# Patient Record
Sex: Female | Born: 1980 | Race: White | Hispanic: No | Marital: Married | Smoking: Former smoker
Health system: Southern US, Community
[De-identification: ages and names within clinical notes are randomized; demographics above are authoritative.]

## PROBLEM LIST (undated history)

## (undated) DIAGNOSIS — Z8719 Personal history of other diseases of the digestive system: Secondary | ICD-10-CM

## (undated) DIAGNOSIS — G43909 Migraine, unspecified, not intractable, without status migrainosus: Secondary | ICD-10-CM

## (undated) DIAGNOSIS — F32A Depression, unspecified: Secondary | ICD-10-CM

## (undated) DIAGNOSIS — Z87442 Personal history of urinary calculi: Secondary | ICD-10-CM

## (undated) DIAGNOSIS — F419 Anxiety disorder, unspecified: Secondary | ICD-10-CM

## (undated) DIAGNOSIS — J189 Pneumonia, unspecified organism: Secondary | ICD-10-CM

## (undated) DIAGNOSIS — M1990 Unspecified osteoarthritis, unspecified site: Secondary | ICD-10-CM

## (undated) HISTORY — PX: ABDOMINAL HYSTERECTOMY: SHX81

## (undated) HISTORY — PX: CHOLECYSTECTOMY: SHX55

## (undated) HISTORY — PX: COLONOSCOPY: SHX174

## (undated) HISTORY — PX: ESOPHAGOGASTRODUODENOSCOPY: SHX1529

## (undated) HISTORY — PX: GASTRIC BYPASS: SHX52

## (undated) HISTORY — PX: OTHER SURGICAL HISTORY: SHX169

## (undated) HISTORY — PX: HERNIA REPAIR: SHX51

---

## 2018-01-02 HISTORY — PX: BARIATRIC SURGERY: SHX1103

## 2019-12-16 ENCOUNTER — Emergency Department
Admission: EM | Admit: 2019-12-16 | Discharge: 2019-12-16 | Disposition: A | Discharge status: Home / Self Care | Attending: Emergency Medicine | Admitting: Emergency Medicine

## 2019-12-16 ENCOUNTER — Emergency Department

## 2019-12-16 ENCOUNTER — Other Ambulatory Visit: Payer: Self-pay

## 2019-12-16 DIAGNOSIS — Z9884 Bariatric surgery status: Secondary | ICD-10-CM | POA: Insufficient documentation

## 2019-12-16 DIAGNOSIS — N200 Calculus of kidney: Secondary | ICD-10-CM | POA: Diagnosis not present

## 2019-12-16 DIAGNOSIS — Z87891 Personal history of nicotine dependence: Secondary | ICD-10-CM | POA: Diagnosis not present

## 2019-12-16 DIAGNOSIS — R1084 Generalized abdominal pain: Secondary | ICD-10-CM

## 2019-12-16 HISTORY — DX: Migraine, unspecified, not intractable, without status migrainosus: G43.909

## 2019-12-16 LAB — COMPREHENSIVE METABOLIC PANEL
ALT: 23 U/L (ref 0–44)
AST: 21 U/L (ref 15–41)
Albumin: 4.3 g/dL (ref 3.5–5.0)
Alkaline Phosphatase: 47 U/L (ref 38–126)
Anion gap: 9 (ref 5–15)
BUN: 9 mg/dL (ref 6–20)
CO2: 24 mmol/L (ref 22–32)
Calcium: 9.2 mg/dL (ref 8.9–10.3)
Chloride: 106 mmol/L (ref 98–111)
Creatinine, Ser: 1.06 mg/dL — ABNORMAL HIGH (ref 0.44–1.00)
GFR calc non Af Amer: >60 mL/min (ref >60)
Glucose, Bld: 149 mg/dL — ABNORMAL HIGH (ref 70–99)
Potassium: 3.8 mmol/L (ref 3.5–5.1)
Sodium: 139 mmol/L (ref 135–145)
Total Bilirubin: 0.5 mg/dL (ref 0.3–1.2)
Total Protein: 7.1 g/dL (ref 6.5–8.1)

## 2019-12-16 LAB — CBC
HCT: 42.5 % (ref 36.0–46.0)
Hemoglobin: 14.4 g/dL (ref 12.0–15.0)
MCH: 32.4 pg (ref 26.0–34.0)
MCHC: 33.9 g/dL (ref 30.0–36.0)
MCV: 95.7 fL (ref 80.0–100.0)
Platelets: 308 10*3/uL (ref 150–400)
RBC: 4.44 MIL/uL (ref 3.87–5.11)
RDW: 12.4 % (ref 11.5–15.5)
WBC: 8.3 10*3/uL (ref 4.0–10.5)
nRBC: 0 % (ref 0.0–0.2)

## 2019-12-16 LAB — URINALYSIS, COMPLETE (UACMP) WITH MICROSCOPIC
Bilirubin Urine: NEGATIVE
Glucose, UA: NEGATIVE mg/dL
Ketones, ur: NEGATIVE mg/dL
Leukocytes,Ua: NEGATIVE
Nitrite: NEGATIVE
Protein, ur: NEGATIVE mg/dL
Specific Gravity, Urine: 1.013 (ref 1.005–1.030)
pH: 5 (ref 5.0–8.0)

## 2019-12-16 LAB — POCT PREGNANCY, URINE: Preg Test, Ur: NEGATIVE

## 2019-12-16 LAB — LIPASE, BLOOD: Lipase: 42 U/L (ref 11–51)

## 2019-12-16 LAB — LACTIC ACID, PLASMA: Lactic Acid, Venous: 0.9 mmol/L (ref 0.5–1.9)

## 2019-12-16 MED ORDER — ONDANSETRON HCL 4 MG/2ML IJ SOLN
4.0000 mg | Freq: Once | INTRAMUSCULAR | Status: AC
  Administered 2019-12-16: 4 mg via INTRAVENOUS
  Filled 2019-12-16: qty 2

## 2019-12-16 MED ORDER — MORPHINE SULFATE (PF) 4 MG/ML IV SOLN
4.0000 mg | Freq: Once | INTRAVENOUS | Status: AC
  Administered 2019-12-16: 4 mg via INTRAVENOUS
  Filled 2019-12-16: qty 1

## 2019-12-16 MED ORDER — IOHEXOL 9 MG/ML PO SOLN
500.0000 mL | Freq: Once | ORAL | Status: AC
  Administered 2019-12-16: 500 mL via ORAL
  Filled 2019-12-16: qty 500

## 2019-12-16 MED ORDER — IOHEXOL 9 MG/ML PO SOLN
500.0000 mL | ORAL | Status: DC
  Filled 2019-12-16 (×2): qty 500

## 2019-12-16 MED ORDER — IOHEXOL 300 MG/ML  SOLN
100.0000 mL | Freq: Once | INTRAMUSCULAR | Status: AC | PRN
  Administered 2019-12-16: 100 mL via INTRAVENOUS
  Filled 2019-12-16: qty 100

## 2019-12-16 MED ORDER — LACTATED RINGERS IV BOLUS
1000.0000 mL | Freq: Once | INTRAVENOUS | Status: AC
  Administered 2019-12-16: 1000 mL via INTRAVENOUS

## 2019-12-16 MED ORDER — DROPERIDOL 2.5 MG/ML IJ SOLN
2.5000 mg | Freq: Once | INTRAMUSCULAR | Status: AC
  Administered 2019-12-16: 2.5 mg via INTRAVENOUS
  Filled 2019-12-16: qty 2

## 2019-12-16 NOTE — Discharge Instructions (Signed)
You were seen in the ED because of your abdominal pain. We did extensive blood work that showed no signs of illness. We did a CT scan with contrast that showed normal-appearing intestines and no evidence of obstruction.  Continue your normal medications at home.  I would recommend a bland diet for the next few days to settle your stomach.  Follow-up with your PCP.  Return to the ED with any worsening symptoms

## 2019-12-16 NOTE — ED Triage Notes (Signed)
Pt c/o generalized abd pain with N/V over the past week, states she has not had a BM in 2 days. States she has had gastric bipass, and other multiple abd surgeries

## 2019-12-16 NOTE — ED Provider Notes (Signed)
[facility] Emergency Department Provider Note ____________________________________________   First MD Initiated Contact with Patient 12/16/19 1432     (approximate)  I have reviewed the triage vital signs and the nursing notes.  HISTORY  Chief Complaint Abdominal Pain   HPI Karen Park is a 39 y.o. femalewho presents to the ED for evaluation of abdominal pain.   Chart review indicates history of migraines and multiple previous abdominal surgeries. Patient reports recently moving to the area from [facility], her husband is deployed with the [employer].  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   Patient reports a couple days of abdominal discomfort/cramping sensation that was self-limiting and she minimizes the symptoms, and occurred about 7 days ago. Patient reports developing severe global abdominal pain, nausea, vomiting and inability to tolerate p.o. intake for the past 2 days.  She reports she is unable to keep anything down as it immediately causes her to vomit.  She reports severe global pain that is 10/10 intensity, for which she has not taken any medications.  Nonradiating and aching in nature.  Denies dysuria, vaginal discharge or bleeding.  She reports she does not recall passing any gas for the past 2 days, but is uncertain of this.  Denies fevers, chest pain, syncope.  Past Medical History:  Diagnosis Date  . Migraine     There are no problems to display for this patient.   Past Surgical History:  Procedure Laterality Date  . CESAREAN SECTION    . CHOLECYSTECTOMY    . GASTRIC BYPASS    . HERNIA REPAIR     x2    Prior to Admission medications   Not on File    Allergies Patient has no known allergies.  No family history on file.  Social History Social History   Tobacco Use  . Smoking status: Former Games developer  . Smokeless tobacco: Never Used  Vaping Use  . Vaping Use:  Every day  Substance Use Topics  . Alcohol use: Not Currently  . Drug use: Not Currently    Review of Systems  Constitutional: No fever/chills Eyes: No visual changes. ENT: No sore throat. Cardiovascular: Denies chest pain. Respiratory: Denies shortness of breath. Gastrointestinal: Positive for abdominal pain, nausea, vomiting and constipation.  No diarrhea. Genitourinary: Negative for dysuria. Musculoskeletal: Negative for back pain. Skin: Negative for rash. Neurological: Negative for headaches, focal weakness or numbness. ____________________________________________   PHYSICAL EXAM:  VITAL SIGNS: Vitals:   12/16/19 1700 12/16/19 1800  BP:  128/90  Pulse:  72  Resp: 18 18  Temp:  98.6 F (37 C)  SpO2:  100%    Constitutional: Alert and oriented.  Uncomfortable-appearing.  Curled up in a ball on her left side due to pain.  Appears to be extremely uncomfortable when changing positions in the bed to allow me to examine her abdomen. Eyes: Conjunctivae are normal. PERRL. EOMI. Head: Atraumatic. Nose: No congestion/rhinnorhea. Mouth/Throat: Mucous membranes are dry.  Oropharynx non-erythematous. Neck: No stridor. No cervical spine tenderness to palpation. Cardiovascular: Normal rate, regular rhythm. Grossly normal heart sounds.  Good peripheral circulation. Respiratory: Normal respiratory effort.  No retractions. Lungs CTAB. Gastrointestinal: Firm and diffusely tender with voluntary guarding. Difficult to get her to relax  No abdominal bruits. No CVA tenderness. Musculoskeletal: No lower extremity tenderness nor edema.  No joint effusions. No signs of acute trauma. Neurologic:  Normal speech and language. No gross focal neurologic deficits are appreciated. No gait instability noted.  Skin:  Skin is warm, dry and intact. No rash noted. Psychiatric: Mood and affect are normal. Speech and behavior are normal.  ____________________________________________   LABS (all labs  ordered are listed, but only abnormal results are displayed)  Labs Reviewed  COMPREHENSIVE METABOLIC PANEL - Abnormal; Notable for the following components:      Result Value   Glucose, Bld 149 (*)    Creatinine, Ser 1.06 (*)    All other components within normal limits  URINALYSIS, COMPLETE (UACMP) WITH MICROSCOPIC - Abnormal; Notable for the following components:   Color, Urine YELLOW (*)    APPearance HAZY (*)    Hgb urine dipstick MODERATE (*)    Bacteria, UA RARE (*)    All other components within normal limits  LIPASE, BLOOD  CBC  LACTIC ACID, PLASMA  POC URINE PREG, ED  POCT PREGNANCY, URINE   ____________________________________________  RADIOLOGY  ED MD interpretation:   Official radiology report(s): CT ABDOMEN PELVIS W CONTRAST  Result Date: 12/16/2019 CLINICAL DATA:  Abdominal pain. History of multiple abdominal surgeries EXAM: CT ABDOMEN AND PELVIS WITH CONTRAST TECHNIQUE: Multidetector CT imaging of the abdomen and pelvis was performed using the standard protocol following bolus administration of intravenous contrast. CONTRAST:  OMNIPAQUE IOHEXOL 300 MG/ML  SOLN COMPARISON:  None. FINDINGS: Lower chest: No acute abnormality. Hepatobiliary: No focal liver abnormality is seen. Status post cholecystectomy. No biliary dilatation. Pancreas: Unremarkable. No pancreatic ductal dilatation or surrounding inflammatory changes. Spleen: Normal in size without focal abnormality. Adrenals/Urinary Tract: Unremarkable adrenal glands. 2 mm nonobstructing stone within the upper pole of the left kidney. No right-sided renal calculi. Kidneys enhance symmetrically. No hydronephrosis. Urinary bladder is within normal limits. Stomach/Bowel: Postsurgical changes of Roux-en-Y gastric bypass. Contrast is present within the small bowel and proximal colon. No dilated loops of bowel to suggest obstruction. A normal appendix is identified within the right lower quadrant (series 5, images 30-35). No  focal bowel thickening or inflammatory changes are evident. Vascular/Lymphatic: No significant vascular findings are present. No enlarged abdominal or pelvic lymph nodes. Reproductive: Uterus and bilateral adnexa are unremarkable. Other: Small volume of simple free fluid within the cul-de-sac. No organized abdominopelvic fluid collection. No pneumoperitoneum. Postsurgical changes from ventral abdominal wall hernia. No new or recurrent abdominal wall hernias. Musculoskeletal: No acute or significant osseous findings. IMPRESSION: 1. No acute abdominopelvic findings. Normal appendix. 2. Postsurgical changes of Roux-en-Y gastric bypass. No evidence of bowel obstruction. 3. Nonobstructing 2 mm left renal stone. 4. Small volume of simple free fluid within the cul-de-sac, which may be physiologic. Electronically Signed   By: Duanne Guess D.O.   On: 12/16/2019 17:02   ____________________________________________   PROCEDURES and INTERVENTIONS  Procedure(s) performed (including Critical Care):  Procedures  Medications  lactated ringers bolus 1,000 mL (0 mLs Intravenous Stopped 12/16/19 1757)  morphine 4 MG/ML injection 4 mg (4 mg Intravenous Given 12/16/19 1513)  ondansetron (ZOFRAN) injection 4 mg (4 mg Intravenous Given 12/16/19 1511)  iohexol (OMNIPAQUE) 9 MG/ML oral solution 500 mL (500 mLs Oral Contrast Given 12/16/19 1522)  morphine 4 MG/ML injection 4 mg (4 mg Intravenous Given 12/16/19 1618)  iohexol (OMNIPAQUE) 300 MG/ML solution 100 mL (100 mLs Intravenous Contrast Given 12/16/19 1629)  droperidol (INAPSINE) 2.5 MG/ML injection 2.5 mg (2.5 mg Intravenous Given 12/16/19 1724)    ____________________________________________   MDM / ED COURSE  39 year old female with extensive abdominal surgical history presents to the ED with generalized pain and vomiting, found to have no evidence of acute  pathology and ultimately amenable to outpatient management.  Normal vital signs on room air throughout.   Exam initially demonstrates an uncomfortable-appearing woman who is tearful, curled up in a ball and with significant voluntary abdominal guarding.  This makes it difficult to get a good examination as she has difficulty relaxing.  Blood work returns unremarkable with no leukocytosis, evidence of acidosis, lactic acidosis or any systemic illness.  Due to her extensive surgical history, CT imaging of her abdomen/pelvis with p.o. and IV contrast obtained and without evidence of SBO, internal hernia or other acute intra-abdominal pathology.  Patient has resolving symptoms after droperidol administration, tolerating liquids prior to discharge.  Her abdominal exam is much improved at this time, she is more relaxed with a soft and benign abdomen without peritoneal signs or significant tenderness.  I see no evidence of acute pathology to warrant further advanced imaging or hospitalization.  Advised patient of outpatient management and return precautions for the ED.  Patient medically stable for discharge home.  Clinical Course as of Dec 16 1855  Tue Dec 16, 2019  1605 Reassessed.  Patient reports marginal improvement of her pain.  Reexamination reveals continued tenderness.  Additional morphine ordered.  She has tolerated her oral contrast, and we will get CT imaging soon.   [DS]  1715 Educated patient on reassuring CT, and work-up overall without evidence of acute pathology.  She reports that she "just knows" that something is wrong.   [DS]  1736 Reassessed after droperidol.  Patient reports improving symptoms.  She is more mobile in the bed and looks improved to me.  We discussed outpatient management and return precautions for the ED.   [DS]    Clinical Course User Index [DS] Delton Prairie, MD   ____________________________________________   FINAL CLINICAL IMPRESSION(S) / ED DIAGNOSES  Final diagnoses:  Generalized abdominal pain  History of Roux-en-Y gastric bypass    ED Discharge Orders    None        Julen Rubert Katrinka Blazing   Note:  This document was prepared using Dragon voice recognition software and may include unintentional dictation errors.   Delton Prairie, MD 12/16/19 [phone]

## 2020-01-27 ENCOUNTER — Emergency Department
Admission: EM | Admit: 2020-01-27 | Discharge: 2020-01-27 | Disposition: A | Discharge status: Home / Self Care | Attending: Emergency Medicine | Admitting: Emergency Medicine

## 2020-01-27 ENCOUNTER — Other Ambulatory Visit: Payer: Self-pay

## 2020-01-27 DIAGNOSIS — R109 Unspecified abdominal pain: Secondary | ICD-10-CM | POA: Diagnosis not present

## 2020-01-27 DIAGNOSIS — K5900 Constipation, unspecified: Secondary | ICD-10-CM | POA: Insufficient documentation

## 2020-01-27 DIAGNOSIS — Z5321 Procedure and treatment not carried out due to patient leaving prior to being seen by health care provider: Secondary | ICD-10-CM | POA: Diagnosis not present

## 2020-01-27 DIAGNOSIS — R140 Abdominal distension (gaseous): Secondary | ICD-10-CM | POA: Insufficient documentation

## 2020-01-27 LAB — COMPREHENSIVE METABOLIC PANEL
ALT: 46 U/L — ABNORMAL HIGH (ref 0–44)
AST: 28 U/L (ref 15–41)
Albumin: 4.1 g/dL (ref 3.5–5.0)
Alkaline Phosphatase: 44 U/L (ref 38–126)
Anion gap: 7 (ref 5–15)
BUN: 18 mg/dL (ref 6–20)
CO2: 27 mmol/L (ref 22–32)
Calcium: 9 mg/dL (ref 8.9–10.3)
Chloride: 107 mmol/L (ref 98–111)
Creatinine, Ser: 0.82 mg/dL (ref 0.44–1.00)
GFR, Estimated: >60 mL/min (ref >60)
Glucose, Bld: 102 mg/dL — ABNORMAL HIGH (ref 70–99)
Potassium: 4.2 mmol/L (ref 3.5–5.1)
Sodium: 141 mmol/L (ref 135–145)
Total Bilirubin: 0.5 mg/dL (ref 0.3–1.2)
Total Protein: 7.2 g/dL (ref 6.5–8.1)

## 2020-01-27 LAB — CBC
HCT: 39 % (ref 36.0–46.0)
Hemoglobin: 13.1 g/dL (ref 12.0–15.0)
MCH: 32.3 pg (ref 26.0–34.0)
MCHC: 33.6 g/dL (ref 30.0–36.0)
MCV: 96.1 fL (ref 80.0–100.0)
Platelets: 310 10*3/uL (ref 150–400)
RBC: 4.06 MIL/uL (ref 3.87–5.11)
RDW: 12.4 % (ref 11.5–15.5)
WBC: 7.5 10*3/uL (ref 4.0–10.5)
nRBC: 0 % (ref 0.0–0.2)

## 2020-01-27 LAB — POC URINE PREG, ED: Preg Test, Ur: NEGATIVE

## 2020-01-27 LAB — URINALYSIS, COMPLETE (UACMP) WITH MICROSCOPIC
Bilirubin Urine: NEGATIVE
Glucose, UA: NEGATIVE mg/dL
Hgb urine dipstick: NEGATIVE
Ketones, ur: NEGATIVE mg/dL
Leukocytes,Ua: NEGATIVE
Nitrite: NEGATIVE
Protein, ur: NEGATIVE mg/dL
Specific Gravity, Urine: 1.012 (ref 1.005–1.030)
pH: 5 (ref 5.0–8.0)

## 2020-01-27 LAB — LIPASE, BLOOD: Lipase: 38 U/L (ref 11–51)

## 2020-01-27 NOTE — ED Notes (Signed)
Pt called in the WR with no response at this time 

## 2020-01-27 NOTE — ED Triage Notes (Signed)
Pt states no bm for 3 days with a hx of bowel obstructions and adhesions, reports attempting miralax without success, reports abd distention

## 2020-01-27 NOTE — ED Notes (Signed)
Pt called in the WR with no response, pt is not visualized at this time 

## 2020-05-31 ENCOUNTER — Inpatient Hospital Stay: Admission: RE | Admit: 2020-05-31 | Source: Ambulatory Visit

## 2020-06-03 ENCOUNTER — Other Ambulatory Visit

## 2020-06-11 ENCOUNTER — Other Ambulatory Visit: Payer: Self-pay | Admitting: Obstetrics and Gynecology

## 2020-07-02 ENCOUNTER — Ambulatory Visit
Admission: RE | Admit: 2020-07-02 | Discharge: 2020-07-02 | Disposition: A | Discharge status: Home / Self Care | Source: Ambulatory Visit | Attending: Certified Nurse Midwife | Admitting: Certified Nurse Midwife

## 2020-07-02 ENCOUNTER — Other Ambulatory Visit: Payer: Self-pay | Admitting: Certified Nurse Midwife

## 2020-07-02 ENCOUNTER — Other Ambulatory Visit (HOSPITAL_COMMUNITY): Payer: Self-pay | Admitting: Certified Nurse Midwife

## 2020-07-02 ENCOUNTER — Other Ambulatory Visit: Payer: Self-pay

## 2020-07-02 DIAGNOSIS — O200 Threatened abortion: Secondary | ICD-10-CM | POA: Insufficient documentation

## 2020-07-05 ENCOUNTER — Other Ambulatory Visit (HOSPITAL_COMMUNITY): Payer: Self-pay | Admitting: Certified Nurse Midwife

## 2020-07-05 ENCOUNTER — Other Ambulatory Visit: Payer: Self-pay | Admitting: Certified Nurse Midwife

## 2020-07-05 DIAGNOSIS — O3680X0 Pregnancy with inconclusive fetal viability, not applicable or unspecified: Secondary | ICD-10-CM

## 2020-07-09 ENCOUNTER — Ambulatory Visit
Admission: RE | Admit: 2020-07-09 | Discharge: 2020-07-09 | Disposition: A | Discharge status: Home / Self Care | Source: Ambulatory Visit | Attending: Certified Nurse Midwife | Admitting: Certified Nurse Midwife

## 2020-07-09 ENCOUNTER — Other Ambulatory Visit: Payer: Self-pay

## 2020-07-09 DIAGNOSIS — O3680X0 Pregnancy with inconclusive fetal viability, not applicable or unspecified: Secondary | ICD-10-CM | POA: Insufficient documentation

## 2020-07-14 ENCOUNTER — Other Ambulatory Visit: Payer: Self-pay | Admitting: Obstetrics and Gynecology

## 2020-07-16 ENCOUNTER — Other Ambulatory Visit

## 2020-07-21 ENCOUNTER — Other Ambulatory Visit

## 2020-07-23 ENCOUNTER — Encounter: Admission: RE | Payer: Self-pay | Source: Home / Self Care

## 2020-07-23 ENCOUNTER — Inpatient Hospital Stay: Admission: RE | Admit: 2020-07-23 | Source: Home / Self Care | Admitting: Obstetrics and Gynecology

## 2020-07-23 SURGERY — HYSTERECTOMY, TOTAL, ABDOMINAL, WITH SALPINGECTOMY
Anesthesia: General | Laterality: Bilateral

## 2020-07-26 NOTE — H&P (Signed)
Karen Park is a 40 y.o.here for TAH / bilateral salpingectomy and possible left oophorectomy  Pt with  heavy bleeding and clots .pt here to rediscuss options for menorrhagia and left pelvic pain .history of gastric bypass , hernia repair x2  l/s from SBO .  Pt wishes to pursue hysterectomy   EMBX insufficient tissue , pap neg  G1P1  Past Medical History:  has a past medical history of Allergy, Anxiety, Depression, GERD (gastroesophageal reflux disease), Migraines, Obesity, OCD (obsessive compulsive disorder), Sleep apnea, and Umbilical hernia.  Past Surgical History:  has a past surgical history that includes Gastric bypass open (02/04/2018); Colon surgery; Cesarean section (2014); Hernia repair (2020); Hernia repair (2014); and Cholecystectomy (10/2015). Family History: family history includes ADD / ADHD in her son; Alcohol abuse in her mother; Alzheimer's disease in her maternal grandfather; Cancer in her maternal grandmother and paternal grandfather; Depression in her sister; High blood pressure (Hypertension) in her father and mother; Hyperlipidemia (Elevated cholesterol) in her father; Insomnia in her sister; Mental illness in her mother; No Known Problems in her paternal grandmother; OCD in her son; ODD in her son; Post-traumatic stress disorder in her brother; Suicidality in her brother. Social History:  reports that she has been smoking cigarettes. She has never used smokeless tobacco. She reports current alcohol use. She reports that she does not use drugs. OB/GYN History:          OB History    Gravida  1   Para      Term      Preterm      AB      Living        SAB      IAB      Ectopic      Molar      Multiple      Live Births             Allergies: has No Known Allergies. Medications:  Current Outpatient Medications:  .  buPROPion (WELLBUTRIN XL) 150 MG XL tablet, Take 1 tablet (150 mg total) by mouth once daily, Disp: 90 tablet, Rfl: 1 .   busPIRone (BUSPAR) 7.5 MG tablet, Take 1 tablet (7.5 mg total) by mouth 2 (two) times daily, Disp: 180 tablet, Rfl: 1 .  cetirizine (ZYRTEC) 10 MG tablet, Take 10 mg by mouth once daily, Disp: , Rfl:  .  cholecalciferol (VITAMIN D3) 1000 unit capsule, Take 1,000 Units by mouth once daily, Disp: , Rfl:  .  cyanocobalamin (VITAMIN B12) 1000 MCG tablet, Take 1,000 mcg by mouth once daily, Disp: , Rfl:  .  FLUoxetine (PROZAC) 40 MG capsule, Take 2 capsules (80 mg total) by mouth once daily, Disp: 180 capsule, Rfl: 1 .  folic acid (FOLVITE) 1 MG tablet, Take 1 mg by mouth once daily, Disp: , Rfl:  .  hydrOXYzine (ATARAX) 50 MG tablet, Take 1 tablet (50 mg total) by mouth once daily, Disp: 90 tablet, Rfl: 1 .  montelukast (SINGULAIR) 10 mg tablet, , Disp: , Rfl:  .  multivitamin-min-iron-FA-vit K (BARIATRIC MULTIVITAMINS) 45 mg iron- 800 mcg-120 mcg Cap, Take 1 tablet by mouth once daily, Disp: , Rfl:  .  nystatin (MYCOSTATIN) 100,000 unit/gram cream, , Disp: , Rfl:  .  traZODone (DESYREL) 100 MG tablet, Take 1 tablet (100 mg total) by mouth nightly, Disp: 90 tablet, Rfl: 1  Current Facility-Administered Medications:  .  cyanocobalamin (VITAMIN B12) injection 1,000 mcg, 1,000 mcg, Intramuscular, Q30 Days, Babaoff, Cassie Freer,  MD, 1,000 mcg at 03/26/20 1534  Review of Systems: General:                      No fatigue or weight loss Eyes:                           No vision changes Ears:                            No hearing difficulty Respiratory:                No cough or shortness of breath Pulmonary:                  No asthma or shortness of breath Cardiovascular:           No chest pain, palpitations, dyspnea on exertion Gastrointestinal:          No abdominal bloating, chronic diarrhea, constipations, masses, pain or hematochezia Genitourinary:             No hematuria, dysuria, abnormal vaginal discharge, pelvic pain, Menometrorrhagia Lymphatic:                   No swollen lymph  nodes Musculoskeletal:         No muscle weakness Neurologic:                  No extremity weakness, syncope, seizure disorder Psychiatric:                  No history of depression, delusions or suicidal/homicidal ideation    Exam:      Vitals:   05/16/221507  BP: 103/63  Pulse: 88    Body mass index is 26.95 kg/m.  WDWN white/ female in NAD   Lungs: CTA  CV : RRR without murmur   Breast: exam done in sitting and lying position : No dimpling or retraction, no dominant mass, no spontaneous discharge, no axillary adenopathy Neck:  no thyromegaly Abdomen: soft , no mass, normal active bowel sounds,  non-tender, no rebound tenderness External genitalia: vulva /labia2x1 SK lesion on right side on mons Urethra: no prolapse Vagina: normal physiologic d/c Cervix: no lesions, no cervical motion tenderness  Uterus: normal size shape and contour, non-tender Adnexa:no mass, non-tender  Rectovaginal: Impression:   The primary encounter diagnosis was Menorrhagia with irregular cycle. A diagnosis of Pelvic pain in female was also pertinent to this visit.    Plan:   After a long discussion about risks of L/S surgery  Given multiple abdominal surgery and the risk of organ injury and alternative options ( lysteda ) vs endometrial ablation  The patient has elected to proceed to TAH / bilateral salpingectomy and possible left oophorectomy .  Risk discussed with pt. See [facility] notes         Vilma Prader, MD

## 2020-08-05 ENCOUNTER — Other Ambulatory Visit: Payer: Self-pay

## 2020-08-05 ENCOUNTER — Other Ambulatory Visit
Admission: RE | Admit: 2020-08-05 | Discharge: 2020-08-05 | Disposition: A | Discharge status: Home / Self Care | Source: Ambulatory Visit | Attending: Obstetrics and Gynecology | Admitting: Obstetrics and Gynecology

## 2020-08-05 HISTORY — DX: Personal history of urinary calculi: Z87.442

## 2020-08-05 HISTORY — DX: Unspecified osteoarthritis, unspecified site: M19.90

## 2020-08-05 HISTORY — DX: Depression, unspecified: F32.A

## 2020-08-05 HISTORY — DX: Anxiety disorder, unspecified: F41.9

## 2020-08-05 HISTORY — DX: Pneumonia, unspecified organism: J18.9

## 2020-08-05 HISTORY — DX: Personal history of other diseases of the digestive system: Z87.19

## 2020-08-05 NOTE — Patient Instructions (Addendum)
Your procedure is scheduled on: 08/16/20- Monday Report to the Registration Desk on the 1st floor of the [facility]. To find out your arrival time, please call [phone] between 1PM - 3PM on: 08/13/20 - Friday Report to [facility] on 08/12/20 at 8 am for Labs and Covid Test.  REMEMBER: Instructions that are not followed completely may result in serious medical risk, up to and including death; or upon the discretion of your surgeon and anesthesiologist your surgery may need to be rescheduled.  Do not eat food after midnight the night before surgery.  No gum chewing, lozengers or hard candies.  You may however, drink CLEAR liquids up to 2 hours before you are scheduled to arrive for your surgery. Do not drink anything within 2 hours of your scheduled arrival time.  Clear liquids include: - water  - apple juice without pulp - gatorade (not RED, PURPLE, OR BLUE) - black coffee or tea (Do NOT add milk or creamers to the coffee or tea) Do NOT drink anything that is not on this list.  In addition, your doctor has ordered for you to drink the provided  Ensure Pre-Surgery Clear Carbohydrate Drink  Drinking this carbohydrate drink up to two hours before surgery helps to reduce insulin resistance and improve patient outcomes. Please complete drinking 2 hours prior to scheduled arrival time.  TAKE THESE MEDICATIONS THE MORNING OF SURGERY WITH A SIP OF WATER:  - buPROPion (WELLBUTRIN XL) 150 MG 24 hr tablet - busPIRone (BUSPAR) 7.5 MG tablet - FLUoxetine (PROZAC) 40 MG capsule  One week prior to surgery: Stop Anti-inflammatories (NSAIDS) such as Advil, Aleve, Ibuprofen, Motrin, Naproxen, Naprosyn and Aspirin based products such as Excedrin, Goodys Powder, BC Powder.  Stop ANY OVER THE COUNTER supplements until after surgery.  No Alcohol for 24 hours before or after surgery.  No Smoking including e-cigarettes for 24 hours prior to surgery.  No chewable tobacco products for at least 6  hours prior to surgery.  No nicotine patches on the day of surgery.  Do not use any "recreational" drugs for at least a week prior to your surgery.  Please be advised that the combination of cocaine and anesthesia may have negative outcomes, up to and including death. If you test positive for cocaine, your surgery will be cancelled.  On the morning of surgery brush your teeth with toothpaste and water, you may rinse your mouth with mouthwash if you wish. Do not swallow any toothpaste or mouthwash.  Do not wear jewelry, make-up, hairpins, clips or nail polish.  Do not wear lotions, powders, or perfumes.   Do not shave body from the neck down 48 hours prior to surgery just in case you cut yourself which could leave a site for infection.  Also, freshly shaved skin may become irritated if using the CHG soap.  Contact lenses, hearing aids and dentures may not be worn into surgery.  Do not bring valuables to the hospital. [facility] is not responsible for any missing/lost belongings or valuables.   Use CHG Soap or wipes as directed on instruction sheet.  Bring your CPAP machine  with you to the hospital.  Notify your doctor if there is any change in your medical condition (cold, fever, infection).  Wear comfortable clothing (specific to your surgery type) to the hospital.  Plan for stool softeners for home use; pain medications have a tendency to cause constipation. You can also help prevent constipation by eating foods high in fiber such as fruits  and vegetables and drinking plenty of fluids as your diet allows.  After surgery, you can help prevent lung complications by doing breathing exercises.  Take deep breaths and cough every 1-2 hours. Your doctor may order a device called an Incentive Spirometer to help you take deep breaths. When coughing or sneezing, hold a pillow firmly against your incision with both hands. This is called "splinting." Doing this helps protect your incision.  It also decreases belly discomfort.  If you are being admitted to the hospital overnight, leave your suitcase in the car. After surgery it may be brought to your room.  If you are being discharged the day of surgery, you will not be allowed to drive home. You will need a responsible adult (18 years or older) to drive you home and stay with you that night.   If you are taking public transportation, you will need to have a responsible adult (18 years or older) with you. Please confirm with your physician that it is acceptable to use public transportation.   Please call the Pre-admissions Testing Dept. at [phone] if you have any questions about these instructions.  Surgery Visitation Policy:  Patients undergoing a surgery or procedure may have one family member or support person with them as long as that person is not COVID-19 positive or experiencing its symptoms.  That person may remain in the waiting area during the procedure.  Inpatient Visitation:    Visiting hours are 7 a.m. to 8 p.m. Inpatients will be allowed two visitors daily. The visitors may change each day during the patient's stay. No visitors under the age of 59. Any visitor under the age of 31 must be accompanied by an adult. The visitor must pass COVID-19 screenings, use hand sanitizer when entering and exiting the patient's room and wear a mask at all times, including in the patient's room. Patients must also wear a mask when staff or their visitor are in the room. Masking is required regardless of vaccination status.

## 2020-08-12 ENCOUNTER — Other Ambulatory Visit: Payer: Self-pay

## 2020-08-12 ENCOUNTER — Other Ambulatory Visit
Admission: RE | Admit: 2020-08-12 | Discharge: 2020-08-12 | Disposition: A | Discharge status: Home / Self Care | Payer: BLUE CROSS/BLUE SHIELD | Source: Ambulatory Visit | Attending: Obstetrics and Gynecology | Admitting: Obstetrics and Gynecology

## 2020-08-12 DIAGNOSIS — Z01812 Encounter for preprocedural laboratory examination: Secondary | ICD-10-CM | POA: Insufficient documentation

## 2020-08-12 DIAGNOSIS — Z20822 Contact with and (suspected) exposure to covid-19: Secondary | ICD-10-CM | POA: Diagnosis not present

## 2020-08-12 LAB — CBC
HCT: 36.8 % (ref 36.0–46.0)
Hemoglobin: 12.2 g/dL (ref 12.0–15.0)
MCH: 31.9 pg (ref 26.0–34.0)
MCHC: 33.2 g/dL (ref 30.0–36.0)
MCV: 96.1 fL (ref 80.0–100.0)
Platelets: 302 10*3/uL (ref 150–400)
RBC: 3.83 MIL/uL — ABNORMAL LOW (ref 3.87–5.11)
RDW: 12.5 % (ref 11.5–15.5)
WBC: 6.7 10*3/uL (ref 4.0–10.5)
nRBC: 0 % (ref 0.0–0.2)

## 2020-08-12 LAB — TYPE AND SCREEN
ABO/RH(D): A POS
Antibody Screen: NEGATIVE
Extend sample reason: TRANSFUSED

## 2020-08-12 LAB — BASIC METABOLIC PANEL
Anion gap: 7 (ref 5–15)
BUN: 12 mg/dL (ref 6–20)
CO2: 27 mmol/L (ref 22–32)
Calcium: 9 mg/dL (ref 8.9–10.3)
Chloride: 104 mmol/L (ref 98–111)
Creatinine, Ser: 0.86 mg/dL (ref 0.44–1.00)
GFR, Estimated: >60 mL/min (ref >60)
Glucose, Bld: 86 mg/dL (ref 70–99)
Potassium: 3.8 mmol/L (ref 3.5–5.1)
Sodium: 138 mmol/L (ref 135–145)

## 2020-08-13 LAB — SARS CORONAVIRUS 2 (TAT 6-24 HRS): SARS Coronavirus 2: NEGATIVE

## 2020-08-16 ENCOUNTER — Other Ambulatory Visit: Payer: Self-pay

## 2020-08-16 ENCOUNTER — Inpatient Hospital Stay: Payer: BLUE CROSS/BLUE SHIELD | Admitting: Anesthesiology

## 2020-08-16 ENCOUNTER — Inpatient Hospital Stay
Admission: RE | Admit: 2020-08-16 | Discharge: 2020-08-18 | DRG: 743 | Disposition: A | Discharge status: Home / Self Care | Payer: BLUE CROSS/BLUE SHIELD | Attending: Obstetrics and Gynecology | Admitting: Obstetrics and Gynecology

## 2020-08-16 ENCOUNTER — Encounter: Payer: Self-pay | Admitting: Obstetrics and Gynecology

## 2020-08-16 ENCOUNTER — Encounter
Admission: RE | Disposition: A | Discharge status: Home / Self Care | Payer: Self-pay | Source: Home / Self Care | Attending: Obstetrics and Gynecology

## 2020-08-16 DIAGNOSIS — F429 Obsessive-compulsive disorder, unspecified: Secondary | ICD-10-CM | POA: Diagnosis present

## 2020-08-16 DIAGNOSIS — F17210 Nicotine dependence, cigarettes, uncomplicated: Secondary | ICD-10-CM | POA: Diagnosis present

## 2020-08-16 DIAGNOSIS — Z20822 Contact with and (suspected) exposure to covid-19: Secondary | ICD-10-CM | POA: Diagnosis present

## 2020-08-16 DIAGNOSIS — F419 Anxiety disorder, unspecified: Secondary | ICD-10-CM | POA: Diagnosis present

## 2020-08-16 DIAGNOSIS — Z9884 Bariatric surgery status: Secondary | ICD-10-CM | POA: Diagnosis not present

## 2020-08-16 DIAGNOSIS — N920 Excessive and frequent menstruation with regular cycle: Principal | ICD-10-CM | POA: Diagnosis present

## 2020-08-16 DIAGNOSIS — R102 Pelvic and perineal pain: Secondary | ICD-10-CM | POA: Diagnosis present

## 2020-08-16 DIAGNOSIS — Z9889 Other specified postprocedural states: Secondary | ICD-10-CM

## 2020-08-16 DIAGNOSIS — F32A Depression, unspecified: Secondary | ICD-10-CM | POA: Diagnosis present

## 2020-08-16 HISTORY — PX: HYSTERECTOMY ABDOMINAL WITH SALPINGECTOMY: SHX6725

## 2020-08-16 HISTORY — PX: OOPHORECTOMY: SHX6387

## 2020-08-16 LAB — CBC
HCT: 32.8 % — ABNORMAL LOW (ref 36.0–46.0)
Hemoglobin: 11.5 g/dL — ABNORMAL LOW (ref 12.0–15.0)
MCH: 33 pg (ref 26.0–34.0)
MCHC: 35.1 g/dL (ref 30.0–36.0)
MCV: 94.3 fL (ref 80.0–100.0)
Platelets: 254 10*3/uL (ref 150–400)
RBC: 3.48 MIL/uL — ABNORMAL LOW (ref 3.87–5.11)
RDW: 12.5 % (ref 11.5–15.5)
WBC: 13 10*3/uL — ABNORMAL HIGH (ref 4.0–10.5)
nRBC: 0 % (ref 0.0–0.2)

## 2020-08-16 LAB — CREATININE, SERUM
Creatinine, Ser: 0.97 mg/dL (ref 0.44–1.00)
GFR, Estimated: >60 mL/min (ref >60)

## 2020-08-16 LAB — TYPE AND SCREEN
ABO/RH(D): A POS
Antibody Screen: NEGATIVE

## 2020-08-16 LAB — POCT PREGNANCY, URINE: Preg Test, Ur: NEGATIVE

## 2020-08-16 SURGERY — HYSTERECTOMY, TOTAL, ABDOMINAL, WITH SALPINGECTOMY
Anesthesia: General | Laterality: Left

## 2020-08-16 MED ORDER — LACTATED RINGERS IV SOLN: INTRAVENOUS | Status: DC

## 2020-08-16 MED ORDER — CHLORHEXIDINE GLUCONATE 0.12 % MT SOLN: 15.0000 mL | Freq: Once | OROMUCOSAL | Status: AC

## 2020-08-16 MED ORDER — ACETAMINOPHEN 500 MG PO TABS
1000.0000 mg | ORAL_TABLET | Freq: Four times a day (QID) | ORAL | Status: DC
  Administered 2020-08-16 – 2020-08-17 (×3): 1000 mg via ORAL
  Filled 2020-08-16 (×3): qty 2

## 2020-08-16 MED ORDER — ONDANSETRON HCL 4 MG/2ML IJ SOLN
INTRAMUSCULAR | Status: AC
  Filled 2020-08-16: qty 2

## 2020-08-16 MED ORDER — BUPIVACAINE LIPOSOME 1.3 % IJ SUSP
INTRAMUSCULAR | Status: AC
  Filled 2020-08-16: qty 20

## 2020-08-16 MED ORDER — LIDOCAINE HCL (CARDIAC) PF 100 MG/5ML IV SOSY
PREFILLED_SYRINGE | INTRAVENOUS | Status: DC | PRN
  Administered 2020-08-16: 80 mg via INTRAVENOUS

## 2020-08-16 MED ORDER — PHENYLEPHRINE HCL (PRESSORS) 10 MG/ML IV SOLN
INTRAVENOUS | Status: AC
  Filled 2020-08-16: qty 1

## 2020-08-16 MED ORDER — GABAPENTIN 300 MG PO CAPS
ORAL_CAPSULE | ORAL | Status: AC
  Administered 2020-08-16: 300 mg via ORAL
  Filled 2020-08-16: qty 1

## 2020-08-16 MED ORDER — OXYCODONE HCL 5 MG PO TABS
5.0000 mg | ORAL_TABLET | Freq: Once | ORAL | Status: AC | PRN
Start: 2020-08-16 — End: 2020-08-16
  Administered 2020-08-16: 5 mg via ORAL

## 2020-08-16 MED ORDER — PROPOFOL 10 MG/ML IV BOLUS
INTRAVENOUS | Status: DC | PRN
  Administered 2020-08-16: 25 ug/kg/min via INTRAVENOUS
  Administered 2020-08-16: 160 mg via INTRAVENOUS

## 2020-08-16 MED ORDER — ONDANSETRON HCL 4 MG/2ML IJ SOLN
INTRAMUSCULAR | Status: DC | PRN
  Administered 2020-08-16: 4 mg via INTRAVENOUS

## 2020-08-16 MED ORDER — FAMOTIDINE 20 MG PO TABS
ORAL_TABLET | ORAL | Status: AC
  Administered 2020-08-16: 20 mg via ORAL
  Filled 2020-08-16: qty 1

## 2020-08-16 MED ORDER — IBUPROFEN 600 MG PO TABS
600.0000 mg | ORAL_TABLET | Freq: Four times a day (QID) | ORAL | Status: DC
  Filled 2020-08-16: qty 1

## 2020-08-16 MED ORDER — CEFAZOLIN SODIUM-DEXTROSE 2-4 GM/100ML-% IV SOLN
2.0000 g | Freq: Once | INTRAVENOUS | Status: AC
  Administered 2020-08-16: 2 g via INTRAVENOUS

## 2020-08-16 MED ORDER — NALOXONE HCL 0.4 MG/ML IJ SOLN: 0.4000 mg | INTRAMUSCULAR | Status: DC | PRN

## 2020-08-16 MED ORDER — OXYCODONE HCL 5 MG/5ML PO SOLN: 5.0000 mg | Freq: Once | ORAL | Status: AC | PRN

## 2020-08-16 MED ORDER — GABAPENTIN 300 MG PO CAPS: 300.0000 mg | ORAL_CAPSULE | ORAL | Status: AC

## 2020-08-16 MED ORDER — MORPHINE SULFATE (PF) 2 MG/ML IV SOLN
1.0000 mg | INTRAVENOUS | Status: DC | PRN
  Administered 2020-08-16 (×2): 2 mg via INTRAVENOUS
  Filled 2020-08-16 (×2): qty 1

## 2020-08-16 MED ORDER — ORAL CARE MOUTH RINSE: 15.0000 mL | Freq: Once | OROMUCOSAL | Status: AC

## 2020-08-16 MED ORDER — ACETAMINOPHEN 500 MG PO TABS: 1000.0000 mg | ORAL_TABLET | ORAL | Status: AC

## 2020-08-16 MED ORDER — ROCURONIUM BROMIDE 10 MG/ML (PF) SYRINGE
PREFILLED_SYRINGE | INTRAVENOUS | Status: AC
  Filled 2020-08-16: qty 10

## 2020-08-16 MED ORDER — SODIUM CHLORIDE 0.9% FLUSH: 9.0000 mL | INTRAVENOUS | Status: DC | PRN

## 2020-08-16 MED ORDER — DIPHENHYDRAMINE HCL 50 MG/ML IJ SOLN: 12.5000 mg | Freq: Four times a day (QID) | INTRAMUSCULAR | Status: DC | PRN

## 2020-08-16 MED ORDER — HYDROMORPHONE HCL 1 MG/ML IJ SOLN
0.5000 mg | INTRAMUSCULAR | Status: AC | PRN
  Administered 2020-08-16 (×4): 0.5 mg via INTRAVENOUS

## 2020-08-16 MED ORDER — BUPIVACAINE HCL (PF) 0.5 % IJ SOLN
INTRAMUSCULAR | Status: AC
  Filled 2020-08-16: qty 30

## 2020-08-16 MED ORDER — DIPHENHYDRAMINE HCL 50 MG/ML IJ SOLN
INTRAMUSCULAR | Status: DC | PRN
  Administered 2020-08-16: 25 mg via INTRAVENOUS

## 2020-08-16 MED ORDER — KETOROLAC TROMETHAMINE 30 MG/ML IJ SOLN
INTRAMUSCULAR | Status: DC | PRN
  Administered 2020-08-16: 30 mg via INTRAVENOUS

## 2020-08-16 MED ORDER — PROPOFOL 10 MG/ML IV BOLUS
INTRAVENOUS | Status: AC
  Filled 2020-08-16: qty 20

## 2020-08-16 MED ORDER — KETOROLAC TROMETHAMINE 30 MG/ML IJ SOLN
INTRAMUSCULAR | Status: AC
  Filled 2020-08-16: qty 1

## 2020-08-16 MED ORDER — FENTANYL CITRATE (PF) 100 MCG/2ML IJ SOLN
INTRAMUSCULAR | Status: DC | PRN
  Administered 2020-08-16: 50 ug via INTRAVENOUS
  Administered 2020-08-16: 100 ug via INTRAVENOUS
  Administered 2020-08-16: 50 ug via INTRAVENOUS

## 2020-08-16 MED ORDER — MIDAZOLAM HCL 2 MG/2ML IJ SOLN
INTRAMUSCULAR | Status: AC
  Filled 2020-08-16: qty 2

## 2020-08-16 MED ORDER — FENTANYL CITRATE (PF) 100 MCG/2ML IJ SOLN
25.0000 ug | INTRAMUSCULAR | Status: DC | PRN
  Administered 2020-08-16: 50 ug via INTRAVENOUS
  Administered 2020-08-16 (×4): 25 ug via INTRAVENOUS

## 2020-08-16 MED ORDER — OXYCODONE HCL 5 MG PO TABS
ORAL_TABLET | ORAL | Status: AC
  Filled 2020-08-16: qty 1

## 2020-08-16 MED ORDER — ONDANSETRON HCL 4 MG PO TABS: 4.0000 mg | ORAL_TABLET | Freq: Four times a day (QID) | ORAL | Status: DC | PRN

## 2020-08-16 MED ORDER — ONDANSETRON HCL 4 MG/2ML IJ SOLN: 4.0000 mg | Freq: Four times a day (QID) | INTRAMUSCULAR | Status: DC | PRN

## 2020-08-16 MED ORDER — FENTANYL CITRATE (PF) 100 MCG/2ML IJ SOLN
INTRAMUSCULAR | Status: AC
  Filled 2020-08-16: qty 2

## 2020-08-16 MED ORDER — SIMETHICONE 80 MG PO CHEW
80.0000 mg | CHEWABLE_TABLET | Freq: Four times a day (QID) | ORAL | Status: DC | PRN
  Administered 2020-08-18: 80 mg via ORAL
  Filled 2020-08-16: qty 1

## 2020-08-16 MED ORDER — CHLORHEXIDINE GLUCONATE 0.12 % MT SOLN
OROMUCOSAL | Status: AC
  Administered 2020-08-16: 15 mL via OROMUCOSAL
  Filled 2020-08-16: qty 15

## 2020-08-16 MED ORDER — DEXAMETHASONE SODIUM PHOSPHATE 10 MG/ML IJ SOLN
INTRAMUSCULAR | Status: AC
  Filled 2020-08-16: qty 1

## 2020-08-16 MED ORDER — KETOROLAC TROMETHAMINE 30 MG/ML IJ SOLN: 30.0000 mg | Freq: Four times a day (QID) | INTRAMUSCULAR | Status: DC

## 2020-08-16 MED ORDER — DEXAMETHASONE SODIUM PHOSPHATE 10 MG/ML IJ SOLN
INTRAMUSCULAR | Status: DC | PRN
  Administered 2020-08-16: 10 mg via INTRAVENOUS

## 2020-08-16 MED ORDER — MIDAZOLAM HCL 2 MG/2ML IJ SOLN
INTRAMUSCULAR | Status: DC | PRN
  Administered 2020-08-16: 2 mg via INTRAVENOUS

## 2020-08-16 MED ORDER — MEPERIDINE HCL 25 MG/ML IJ SOLN
INTRAMUSCULAR | Status: AC
  Administered 2020-08-16: 25 mg
  Filled 2020-08-16: qty 1

## 2020-08-16 MED ORDER — POVIDONE-IODINE 10 % EX SWAB: 2.0000 "application " | Freq: Once | CUTANEOUS | Status: DC

## 2020-08-16 MED ORDER — LIDOCAINE HCL (PF) 2 % IJ SOLN
INTRAMUSCULAR | Status: AC
  Filled 2020-08-16: qty 5

## 2020-08-16 MED ORDER — HYDROMORPHONE HCL 1 MG/ML IJ SOLN
INTRAMUSCULAR | Status: AC
  Filled 2020-08-16: qty 1

## 2020-08-16 MED ORDER — FLUMAZENIL 0.5 MG/5ML IV SOLN
INTRAVENOUS | Status: AC
  Filled 2020-08-16: qty 10

## 2020-08-16 MED ORDER — SUGAMMADEX SODIUM 200 MG/2ML IV SOLN
INTRAVENOUS | Status: DC | PRN
  Administered 2020-08-16: 154.2 mg via INTRAVENOUS

## 2020-08-16 MED ORDER — OXYCODONE HCL 5 MG PO TABS
5.0000 mg | ORAL_TABLET | ORAL | Status: DC | PRN
Start: 2020-08-16 — End: 2020-08-16
  Administered 2020-08-16: 5 mg via ORAL
  Filled 2020-08-16: qty 1

## 2020-08-16 MED ORDER — FAMOTIDINE 20 MG PO TABS: 20.0000 mg | ORAL_TABLET | Freq: Once | ORAL | Status: AC

## 2020-08-16 MED ORDER — DIPHENHYDRAMINE HCL 12.5 MG/5ML PO ELIX
12.5000 mg | ORAL_SOLUTION | Freq: Four times a day (QID) | ORAL | Status: DC | PRN
  Filled 2020-08-16: qty 5

## 2020-08-16 MED ORDER — MORPHINE SULFATE 1 MG/ML IV SOLN PCA
INTRAVENOUS | Status: DC
Start: 2020-08-16 — End: 2020-08-17
  Administered 2020-08-16: 14.67 mg via INTRAVENOUS
  Administered 2020-08-17: 12.6 mg via INTRAVENOUS
  Administered 2020-08-17: 1 mL via INTRAVENOUS
  Filled 2020-08-16 (×2): qty 30

## 2020-08-16 MED ORDER — NALOXONE HCL 0.4 MG/ML IJ SOLN
INTRAMUSCULAR | Status: AC
  Filled 2020-08-16: qty 2

## 2020-08-16 MED ORDER — ROCURONIUM BROMIDE 100 MG/10ML IV SOLN
INTRAVENOUS | Status: DC | PRN
  Administered 2020-08-16 (×2): 10 mg via INTRAVENOUS
  Administered 2020-08-16: 50 mg via INTRAVENOUS
  Administered 2020-08-16: 10 mg via INTRAVENOUS
  Administered 2020-08-16 (×2): 20 mg via INTRAVENOUS

## 2020-08-16 MED ORDER — CEFAZOLIN SODIUM-DEXTROSE 2-4 GM/100ML-% IV SOLN
INTRAVENOUS | Status: AC
  Filled 2020-08-16: qty 100

## 2020-08-16 MED ORDER — IBUPROFEN 600 MG PO TABS: 600.0000 mg | ORAL_TABLET | Freq: Four times a day (QID) | ORAL | Status: DC

## 2020-08-16 MED ORDER — GABAPENTIN 300 MG PO CAPS
300.0000 mg | ORAL_CAPSULE | Freq: Every day | ORAL | Status: DC
  Administered 2020-08-16 – 2020-08-17 (×2): 300 mg via ORAL
  Filled 2020-08-16 (×2): qty 1

## 2020-08-16 MED ORDER — ENOXAPARIN SODIUM 40 MG/0.4ML IJ SOSY
40.0000 mg | PREFILLED_SYRINGE | INTRAMUSCULAR | Status: DC
  Administered 2020-08-17 – 2020-08-18 (×2): 40 mg via SUBCUTANEOUS
  Filled 2020-08-16 (×2): qty 0.4

## 2020-08-16 MED ORDER — DIPHENHYDRAMINE HCL 50 MG/ML IJ SOLN
INTRAMUSCULAR | Status: AC
  Filled 2020-08-16: qty 1

## 2020-08-16 MED ORDER — SODIUM CHLORIDE (PF) 0.9 % IJ SOLN
INTRAMUSCULAR | Status: AC
  Filled 2020-08-16: qty 50

## 2020-08-16 MED ORDER — ACETAMINOPHEN 500 MG PO TABS
ORAL_TABLET | ORAL | Status: AC
  Administered 2020-08-16: 1000 mg via ORAL
  Filled 2020-08-16: qty 2

## 2020-08-16 MED ORDER — KETOROLAC TROMETHAMINE 30 MG/ML IJ SOLN
30.0000 mg | Freq: Four times a day (QID) | INTRAMUSCULAR | Status: AC
  Administered 2020-08-16 – 2020-08-17 (×3): 30 mg via INTRAVENOUS
  Filled 2020-08-16 (×3): qty 1

## 2020-08-16 MED ORDER — ONDANSETRON HCL 4 MG/2ML IJ SOLN
4.0000 mg | Freq: Once | INTRAMUSCULAR | Status: AC | PRN
  Administered 2020-08-16: 4 mg via INTRAVENOUS

## 2020-08-16 SURGICAL SUPPLY — 43 items
CHLORAPREP W/TINT 26 (MISCELLANEOUS) ×3 IMPLANT
COVER WAND RF STERILE (DRAPES) ×3 IMPLANT
DRAPE LAP W/FLUID (DRAPES) ×3 IMPLANT
DRAPE UNDER BUTTOCK W/FLU (DRAPES) IMPLANT
DRSG TELFA 3X8 NADH (GAUZE/BANDAGES/DRESSINGS) ×3 IMPLANT
ELECT BLADE 6.5 EXT (BLADE) ×3 IMPLANT
ELECT REM PT RETURN 9FT ADLT (ELECTROSURGICAL) ×3
ELECTRODE REM PT RTRN 9FT ADLT (ELECTROSURGICAL) ×2 IMPLANT
GAUZE 4X4 16PLY RFD (DISPOSABLE) ×6 IMPLANT
GAUZE SPONGE 4X4 12PLY STRL (GAUZE/BANDAGES/DRESSINGS) ×3 IMPLANT
GLOVE SURG ENC MOIS LTX SZ7 (GLOVE) ×3 IMPLANT
GLOVE SURG SYN 8.0 (GLOVE) ×6 IMPLANT
GLOVE SURG UNDER POLY LF SZ6.5 (GLOVE) ×6 IMPLANT
GOWN STRL REUS W/ TWL LRG LVL3 (GOWN DISPOSABLE) ×4 IMPLANT
GOWN STRL REUS W/ TWL XL LVL3 (GOWN DISPOSABLE) ×2 IMPLANT
GOWN STRL REUS W/TWL LRG LVL3 (GOWN DISPOSABLE) ×2
GOWN STRL REUS W/TWL XL LVL3 (GOWN DISPOSABLE) ×1
KIT TURNOVER CYSTO (KITS) ×3 IMPLANT
LABEL OR SOLS (LABEL) ×3 IMPLANT
MANIFOLD NEPTUNE II (INSTRUMENTS) ×3 IMPLANT
NEEDLE HYPO 22GX1.5 SAFETY (NEEDLE) ×6 IMPLANT
PACK BASIN MAJOR ARMC (MISCELLANEOUS) ×3 IMPLANT
PENCIL SMOKE EVACUATOR (MISCELLANEOUS) ×3 IMPLANT
RETAINER VISCERA MED (MISCELLANEOUS) IMPLANT
SET CYSTO W/LG BORE CLAMP LF (SET/KITS/TRAYS/PACK) ×3 IMPLANT
SOL PREP PVP 2OZ (MISCELLANEOUS) ×3
SOLUTION PREP PVP 2OZ (MISCELLANEOUS) ×2 IMPLANT
SPONGE LAP 18X18 RF (DISPOSABLE) ×6 IMPLANT
STAPLER INSORB 30 2030 C-SECTI (MISCELLANEOUS) ×3 IMPLANT
STAPLER SKIN PROX 35W (STAPLE) IMPLANT
SURGILUBE 2OZ TUBE FLIPTOP (MISCELLANEOUS) IMPLANT
SUT PDS AB 1 TP1 96 (SUTURE) IMPLANT
SUT VIC AB 0 CT1 27 (SUTURE) ×3
SUT VIC AB 0 CT1 27XCR 8 STRN (SUTURE) ×6 IMPLANT
SUT VIC AB 0 CT1 36 (SUTURE) ×6 IMPLANT
SUT VIC AB 2-0 SH 27 (SUTURE) ×4
SUT VIC AB 2-0 SH 27XBRD (SUTURE) ×8 IMPLANT
SUT VICRYL 0 TIES 12 18 (SUTURE) ×3 IMPLANT
SYR 20ML LL LF (SYRINGE) ×6 IMPLANT
SYR 30ML LL (SYRINGE) ×3 IMPLANT
SYR BULB IRRIG 60ML STRL (SYRINGE) ×3 IMPLANT
TRAY FOLEY MTR SLVR 16FR STAT (SET/KITS/TRAYS/PACK) ×3 IMPLANT
WATER STERILE IRR 1000ML POUR (IV SOLUTION) ×3 IMPLANT

## 2020-08-16 NOTE — Op Note (Signed)
NAMELUA, FENG MEDICAL RECORD NO: [id number] ACCOUNT NO: [id number] DATE OF BIRTH: Jan 18, 1981 FACILITY: [facility] LOCATION: [facility] PHYSICIAN: Suzy Bouchard, MD  Operative Report   DATE OF PROCEDURE: 08/16/2020  PREOPERATIVE DIAGNOSES: 1.  Menorrhagia. 2.  Chronic left pelvic pain. 3.  Multiple prior abdominal surgeries.  POSTOPERATIVE DIAGNOSES: 1.  Menorrhagia. 2.  Chronic left pelvic pain. 3.  Multiple prior abdominal surgeries.  PROCEDURE:   1.  Total abdominal hysterectomy. 2.  Left salpingo-oophorectomy. 3.  Right salpingectomy.  SURGEON:  Suzy Bouchard, MD  FIRST ASSISTANT:  Christeen Douglas, MD  ANESTHESIA:  General endotracheal anesthesia.  INDICATIONS:  This is a 40 year old gravida 2, para 1, patient with a long history of menorrhagia with clot formation, passage unresolved by conservative therapy.  The patient has had multiple abdominal surgeries including gastric bypass surgery,  small-bowel obstruction, 2 ventral hernia repairs, cholecystectomy and cesarean section and has complained of a longstanding history of left pelvic pain.  FINDINGS:  Left fallopian tube densely adherent to the left ovary.  No other significant scar tissue identified.  DESCRIPTION OF PROCEDURE:  After adequate general endotracheal anesthesia, the patient was placed in the dorsal supine position, legs placed in the [place] stirrups.  Patient's abdomen, perineum and vagina were prepped and draped in normal sterile fashion.   Foley catheter was placed prior to draping of the patient.  The patient did receive 2 grams IV Ancef for surgical prophylaxis.  Timeout was performed.  Pfannenstiel incision was made two fingerbreadths above the symphysis pubis.  Sharp dissection was  used to identify the fascia.  Fascia was opened in a transverse fashion.  Peritoneum was opened sharply.  There was notable mesh repair from prior ventral hernia surgery.  An O'Connor-O'Sullivan  retractor was placed into the patient's abdomen and the  bowel was packed cephalad with 4 laparotomy tapes, 2 Kelly clamps were then placed on the cornua to use for uterine retraction.  The round ligaments on both sides were clamped, transected, suture ligated with 0 Vicryl suture.  The anterior leaf of the  broad ligament was incised along the bladder reflection to the midline from both sides.  Bladder was then gently dissected off the lower uterine segment with sharp and blunt dissection.  Given the dense adherence of the left fallopian tube to the ovary  and given the patient's longstanding history of chronic pelvic pain the left infundibulopelvic ligament was bilaterally, clamped, transected, suture ligated with 0 Vicryl suture.  The left fallopian tube and ovary will be incorporated with the final  specimen.  On the right side, the broad ligament was opened and the uteroovarian ligament was bilaterally, clamped and transected the right fallopian tube was dissected free from the right ovary and fallopian tube will be included in the specimen.  The  uterine arteries were then skeletonized bilaterally, bilaterally clamped, transected, suture ligated with 0 Vicryl suture.  Cardinal ligaments were then bilaterally clamped, transected, suture ligated with 0 Vicryl suture and cervix was then clamped  bilaterally and the vagina was then entered and the cervix, uterus, left fallopian tube and ovary and right fallopian tube were then removed and passed off the operative field.  Vaginal cuff was closed with interrupted 0 Vicryl sutures.  Of note, the  ureters were identified prior to final clamping of the uterine arteries and removal of the cervix and uterus.  Normal peristaltic activity was seen.  The patient's abdomen was then copiously irrigated.  Good hemostasis was noted.  All pedicle  suture was  then removed.  Laparotomy sponges were removed.  Irrigation again revealed good hemostasis.  Fascia was then  closed with 0 Vicryl suture in a running nonlocking fashion.  Good approximation of fascial edges.  Subcutaneous tissues were irrigated and Bovie  for hemostasis and the skin was reapproximated with Insorb absorbable staples.  Good cosmetic effect.  ESTIMATED BLOOD LOSS:  150 mL  URINE OUTPUT:  300 mL.  INTRAOPERATIVE FLUIDS:  1300 mL.  The patient did receive 30 mg intravenous Toradol at the end of the case.  Sponge and needle counts were correct and the patient was taken to recovery room in good condition.   PUS D: 08/16/2020 10:35:27 am T: 08/16/2020 11:36:00 am  JOB: [id number]/ [id number]

## 2020-08-16 NOTE — Anesthesia Preprocedure Evaluation (Signed)
Anesthesia Evaluation  Patient identified by MRN, date of birth, ID band Patient awake    Reviewed: Allergy & Precautions, NPO status , Patient's Chart, lab work & pertinent test results  History of Anesthesia Complications Negative for: history of anesthetic complications  Airway Mallampati: II  TM Distance: >3 FB Neck ROM: Full    Dental no notable dental hx. (+) Teeth Intact   Pulmonary sleep apnea and Continuous Positive Airway Pressure Ventilation , neg COPD, Patient abstained from smoking.Not current smoker, former smoker,  OSA but has since lost 200 pounds after gastric bypass. Still wears her cpap in case   Pulmonary exam normal breath sounds clear to auscultation       Cardiovascular Exercise Tolerance: Good METS(-) hypertension(-) CAD and (-) Past MI negative cardio ROS  (-) dysrhythmias  Rhythm:Regular Rate:Normal - Systolic murmurs    Neuro/Psych  Headaches, PSYCHIATRIC DISORDERS Anxiety Depression    GI/Hepatic hiatal hernia, neg GERD  ,(+)     (-) substance abuse  , S/p gastric bypass    Endo/Other  neg diabetes  Renal/GU negative Renal ROS     Musculoskeletal   Abdominal   Peds  Hematology   Anesthesia Other Findings Past Medical History: No date: Anxiety No date: Arthritis     Comment:  both knees, fingers, spine No date: Depression No date: History of hiatal hernia     Comment:  repair 2020 No date: History of kidney stones No date: Migraine No date: Pneumonia  Reproductive/Obstetrics                             Anesthesia Physical Anesthesia Plan  ASA: II  Anesthesia Plan: General   Post-op Pain Management:    Induction: Intravenous  PONV Risk Score and Plan: 4 or greater and Ondansetron, Dexamethasone, Midazolam and Diphenhydramine  Airway Management Planned: Oral ETT  Additional Equipment: None  Intra-op Plan:   Post-operative Plan: Extubation  in OR  Informed Consent: I have reviewed the patients History and Physical, chart, labs and discussed the procedure including the risks, benefits and alternatives for the proposed anesthesia with the patient or authorized representative who has indicated his/her understanding and acceptance.     Dental advisory given  Plan Discussed with: CRNA and Surgeon  Anesthesia Plan Comments: (Discussed risks of anesthesia with patient, including PONV, sore throat, lip/dental damage. Rare risks discussed as well, such as cardiorespiratory and neurological sequelae. Patient understands.)        Anesthesia Quick Evaluation

## 2020-08-16 NOTE — Anesthesia Procedure Notes (Signed)
Procedure Name: Intubation Date/Time: 08/16/2020 7:40 AM Performed by: Rona Ravens, CRNA Pre-anesthesia Checklist: Patient identified, Patient being monitored, Timeout performed, Emergency Drugs available and Suction available Patient Re-evaluated:Patient Re-evaluated prior to induction Oxygen Delivery Method: Circle system utilized Preoxygenation: Pre-oxygenation with 100% oxygen Induction Type: IV induction Ventilation: Mask ventilation without difficulty Laryngoscope Size: Mac and 3 Grade View: Grade I Tube type: Oral Tube size: 6.5 mm Number of attempts: 1 Airway Equipment and Method: Stylet Placement Confirmation: ETT inserted through vocal cords under direct vision,  positive ETCO2 and breath sounds checked- equal and bilateral Secured at: 21 cm Tube secured with: Tape Dental Injury: Teeth and Oropharynx as per pre-operative assessment

## 2020-08-16 NOTE — Transfer of Care (Signed)
Immediate Anesthesia Transfer of Care Note  Patient: Karen Park  Procedure(s) Performed: HYSTERECTOMY ABDOMINAL WITH BILATERAL SALPINGECTOMY  (L) OOPHORECTOMY (Bilateral ) OOPHORECTOMY (Left )  Patient Location: PACU  Anesthesia Type:General  Level of Consciousness: awake, alert  and oriented  Airway & Oxygen Therapy: Patient Spontanous Breathing and Patient connected to face mask oxygen  Post-op Assessment: Report given to RN and Post -op Vital signs reviewed and stable  Post vital signs: Reviewed and stable  Last Vitals:  Vitals Value Taken Time  BP    Temp    Pulse    Resp    SpO2      Last Pain:  Vitals:   08/16/20 0636  TempSrc: Temporal  PainSc: 4       Patients Stated Pain Goal: 3 (08/16/20 0636)  Complications: No complications documented.

## 2020-08-16 NOTE — Anesthesia Postprocedure Evaluation (Signed)
Anesthesia Post Note  Patient: Karen Park  Procedure(s) Performed: HYSTERECTOMY ABDOMINAL WITH BILATERAL SALPINGECTOMY  (L) OOPHORECTOMY (Bilateral ) OOPHORECTOMY (Left )  Patient location during evaluation: PACU Anesthesia Type: General Level of consciousness: awake and alert Pain management: pain level controlled Vital Signs Assessment: post-procedure vital signs reviewed and stable Respiratory status: spontaneous breathing, nonlabored ventilation, respiratory function stable and patient connected to nasal cannula oxygen Cardiovascular status: blood pressure returned to baseline and stable Postop Assessment: no apparent nausea or vomiting Anesthetic complications: no   No complications documented.   Last Vitals:  Vitals:   08/16/20 1100 08/16/20 1115  BP: 119/80 122/83  Pulse: 92 97  Resp: 18 14  Temp:  36.9 C  SpO2: 97% 98%    Last Pain:  Vitals:   08/16/20 1115  TempSrc:   PainSc: Asleep                 Corinda Gubler

## 2020-08-16 NOTE — Progress Notes (Signed)
Day of Surgery Procedure(s) (LRB): HYSTERECTOMY ABDOMINAL WITH BILATERAL SALPINGECTOMY  (L) OOPHORECTOMY (Bilateral) OOPHORECTOMY (Left)  Subjective: Patient reports incisional pain.    Objective: I have reviewed patient's vital signs and intake and output.  small d/c on incisional dressing   Assessment: s/p Procedure(s): HYSTERECTOMY ABDOMINAL WITH BILATERAL SALPINGECTOMY  (L) OOPHORECTOMY (Bilateral) OOPHORECTOMY (Left): stable  Plan: switch to PCA for overnight  LOS: 0 days    Karen Park 08/16/2020, 6:24 PM

## 2020-08-16 NOTE — Progress Notes (Signed)
Pt ready for TAH/ BS possible left oophorectomy for Menorrhagia and pelvic pain . All question answered . NPO . Labs reviewed . Proceed

## 2020-08-16 NOTE — Brief Op Note (Signed)
08/16/2020  9:54 AM  PATIENT:  Karen Park  40 y.o. female  PRE-OPERATIVE DIAGNOSIS:  Menorrhagia, left pelvic pain  POST-OPERATIVE DIAGNOSIS:  Menorrhagia, left pelvic pain  PROCEDURE:  Procedure(s): HYSTERECTOMY ABDOMINAL WITH BILATERAL SALPINGECTOMY  (L) OOPHORECTOMY (Bilateral) OOPHORECTOMY (Left)  SURGEON:  Surgeon(s) and Role:    * Aidyn Kellis, Ihor Austin, MD - Primary    * Christeen Douglas, MD - Assisting  PHYSICIAN ASSISTANT: cst   ASSISTANTS: none   ANESTHESIA:   general  EBL:  150 mL  IOF 1300 UO 300cc  BLOOD ADMINISTERED:none  DRAINS: Urinary Catheter (Foley)   LOCAL MEDICATIONS USED:  NONE  SPECIMEN:  Source of Specimen:  cervix , uterus  bilateral fallopian tubes and left ovary   DISPOSITION OF SPECIMEN:  PATHOLOGY  COUNTS:  YES  TOURNIQUET:  * No tourniquets in log *  DICTATION: .Other Dictation: Dictation Number verbal   PLAN OF CARE: Admit to inpatient   PATIENT DISPOSITION:  PACU - hemodynamically stable.   Delay start of Pharmacological VTE agent (>24hrs) due to surgical blood loss or risk of bleeding: not applicable

## 2020-08-17 LAB — BASIC METABOLIC PANEL
Anion gap: 3 — ABNORMAL LOW (ref 5–15)
BUN: 7 mg/dL (ref 6–20)
CO2: 25 mmol/L (ref 22–32)
Calcium: 8.3 mg/dL — ABNORMAL LOW (ref 8.9–10.3)
Chloride: 102 mmol/L (ref 98–111)
Creatinine, Ser: 0.81 mg/dL (ref 0.44–1.00)
GFR, Estimated: >60 mL/min (ref >60)
Glucose, Bld: 100 mg/dL — ABNORMAL HIGH (ref 70–99)
Potassium: 3.5 mmol/L (ref 3.5–5.1)
Sodium: 130 mmol/L — ABNORMAL LOW (ref 135–145)

## 2020-08-17 LAB — CBC
HCT: 32.4 % — ABNORMAL LOW (ref 36.0–46.0)
Hemoglobin: 11 g/dL — ABNORMAL LOW (ref 12.0–15.0)
MCH: 32.8 pg (ref 26.0–34.0)
MCHC: 34 g/dL (ref 30.0–36.0)
MCV: 96.7 fL (ref 80.0–100.0)
Platelets: 243 10*3/uL (ref 150–400)
RBC: 3.35 MIL/uL — ABNORMAL LOW (ref 3.87–5.11)
RDW: 12.5 % (ref 11.5–15.5)
WBC: 7.9 10*3/uL (ref 4.0–10.5)
nRBC: 0 % (ref 0.0–0.2)

## 2020-08-17 MED ORDER — OXYCODONE HCL 5 MG PO TABS
5.0000 mg | ORAL_TABLET | ORAL | Status: DC | PRN
  Administered 2020-08-17 – 2020-08-18 (×3): 10 mg via ORAL
  Filled 2020-08-17 (×2): qty 1
  Filled 2020-08-17 (×2): qty 2

## 2020-08-17 MED ORDER — ACETAMINOPHEN 500 MG PO TABS
1000.0000 mg | ORAL_TABLET | Freq: Four times a day (QID) | ORAL | Status: DC
  Administered 2020-08-17 – 2020-08-18 (×4): 1000 mg via ORAL
  Filled 2020-08-17 (×4): qty 2

## 2020-08-17 MED ORDER — IBUPROFEN 800 MG PO TABS: 800.0000 mg | ORAL_TABLET | Freq: Three times a day (TID) | ORAL | Status: DC | PRN

## 2020-08-17 MED ORDER — ACETAMINOPHEN 500 MG PO TABS: 1000.0000 mg | ORAL_TABLET | Freq: Four times a day (QID) | ORAL | Status: DC | PRN

## 2020-08-17 MED ORDER — OXYCODONE-ACETAMINOPHEN 5-325 MG PO TABS
1.0000 | ORAL_TABLET | ORAL | Status: DC | PRN
  Administered 2020-08-17: 2 via ORAL
  Filled 2020-08-17: qty 2

## 2020-08-17 MED ORDER — IBUPROFEN 600 MG PO TABS: 600.0000 mg | ORAL_TABLET | Freq: Four times a day (QID) | ORAL | Status: DC

## 2020-08-17 MED ORDER — DOCUSATE SODIUM 100 MG PO CAPS
100.0000 mg | ORAL_CAPSULE | Freq: Every day | ORAL | Status: DC
  Administered 2020-08-17 – 2020-08-18 (×2): 100 mg via ORAL
  Filled 2020-08-17 (×2): qty 1

## 2020-08-17 NOTE — Progress Notes (Signed)
Obstetric and Gynecology  Subjective  Karen Park is a 40 y.o. female No obstetric history on file. who presented on 08/16/2020 for  POD#1 TAH LSo and BS . PAin elevated last pm and she was placed on a PCA . PAin much better this am . + Flatus        Objective   Vitals:   08/17/20 0748 08/17/20 0752  BP: 112/76   Pulse: 89   Resp: 16 13  Temp: 98.9 F (37.2 C)   SpO2: 96% 97%     Intake/Output Summary (Last 24 hours) at 08/17/2020 0936 Last data filed at 08/17/2020 2633 Gross per 24 hour  Intake 2660.11 ml  Output 3145 ml  Net -484.89 ml    General: NAD Cardiovascular: RRR, no murmurs Pulmonary: CTAB Abdomen: Benign. Non-tender, +BS, no guarding. Extremities: No erythema or cords, no calf tenderness, +warmth with normal peripheral pulses.  Labs: Results for orders placed or performed during the hospital encounter of 08/16/20 (from the past 24 hour(s))  CBC     Status: Abnormal   Collection Time: 08/16/20 12:12 PM  Result Value Ref Range   WBC 13.0 (H) 4.0 - 10.5 K/uL   RBC 3.48 (L) 3.87 - 5.11 MIL/uL   Hemoglobin 11.5 (L) 12.0 - 15.0 g/dL   HCT 35.4 (L) 56.2 - 56.3 %   MCV 94.3 80.0 - 100.0 fL   MCH 33.0 26.0 - 34.0 pg   MCHC 35.1 30.0 - 36.0 g/dL   RDW 89.3 73.4 - 28.7 %   Platelets 254 150 - 400 K/uL   nRBC 0.0 0.0 - 0.2 %  Creatinine, serum     Status: None   Collection Time: 08/16/20 12:12 PM  Result Value Ref Range   Creatinine, Ser 0.97 0.44 - 1.00 mg/dL   GFR, Estimated >68 >11 mL/min  CBC     Status: Abnormal   Collection Time: 08/17/20  6:14 AM  Result Value Ref Range   WBC 7.9 4.0 - 10.5 K/uL   RBC 3.35 (L) 3.87 - 5.11 MIL/uL   Hemoglobin 11.0 (L) 12.0 - 15.0 g/dL   HCT 57.2 (L) 62.0 - 35.5 %   MCV 96.7 80.0 - 100.0 fL   MCH 32.8 26.0 - 34.0 pg   MCHC 34.0 30.0 - 36.0 g/dL   RDW 97.4 16.3 - 84.5 %   Platelets 243 150 - 400 K/uL   nRBC 0.0 0.0 - 0.2 %  Basic metabolic panel     Status: Abnormal   Collection Time: 08/17/20  6:14 AM  Result  Value Ref Range   Sodium 130 (L) 135 - 145 mmol/L   Potassium 3.5 3.5 - 5.1 mmol/L   Chloride 102 98 - 111 mmol/L   CO2 25 22 - 32 mmol/L   Glucose, Bld 100 (H) 70 - 99 mg/dL   BUN 7 6 - 20 mg/dL   Creatinine, Ser 3.64 0.44 - 1.00 mg/dL   Calcium 8.3 (L) 8.9 - 10.3 mg/dL   GFR, Estimated >68 >03 mL/min   Anion gap 3 (L) 5 - 15    Cultures: Results for orders placed or performed during the hospital encounter of 08/12/20  SARS CORONAVIRUS 2 (TAT 6-24 HRS) Nasopharyngeal Nasopharyngeal Swab     Status: None   Collection Time: 08/12/20  8:36 AM   Specimen: Nasopharyngeal Swab  Result Value Ref Range Status   SARS Coronavirus 2 NEGATIVE NEGATIVE Final    Comment: (NOTE) SARS-CoV-2 target nucleic acids are NOT DETECTED.  The SARS-CoV-2 RNA is generally detectable in upper and lower respiratory specimens during the acute phase of infection. Negative results do not preclude SARS-CoV-2 infection, do not rule out co-infections with other pathogens, and should not be used as the sole basis for treatment or other patient management decisions. Negative results must be combined with clinical observations, patient history, and epidemiological information. The expected result is Negative.  Fact Sheet for Patients: HairSlick.no  Fact Sheet for Healthcare Providers: quierodirigir.com  This test is not yet approved or cleared by the [place] FDA and  has been authorized for detection and/or diagnosis of SARS-CoV-2 by FDA under an Emergency Use Authorization (EUA). This EUA will remain  in effect (meaning this test can be used) for the duration of the COVID-19 declaration under Se ction 564(b)(1) of the Act, 21 U.S.C. section 360bbb-3(b)(1), unless the authorization is terminated or revoked sooner.  Performed at [facility], [address] [place], [place] [address]     Imaging: No results found.   Assessment   40  y.o. No obstetric history on file. Hospital Day: 2 POD#1 doing well    Plan   1  D/C PCA+ foley 2. Advance reg diet  3. Po pain meds  4. OUT of bed today

## 2020-08-18 LAB — SURGICAL PATHOLOGY

## 2020-08-18 MED ORDER — BUSPIRONE HCL 15 MG PO TABS
7.5000 mg | ORAL_TABLET | Freq: Two times a day (BID) | ORAL | Status: DC
  Administered 2020-08-18: 7.5 mg via ORAL
  Filled 2020-08-18 (×2): qty 1

## 2020-08-18 MED ORDER — HYDROCODONE-ACETAMINOPHEN 5-325 MG PO TABS: 1.0000 | ORAL_TABLET | Freq: Four times a day (QID) | ORAL | 0 refills | Status: AC | PRN

## 2020-08-18 MED ORDER — ONDANSETRON HCL 4 MG PO TABS: 4.0000 mg | ORAL_TABLET | Freq: Three times a day (TID) | ORAL | 0 refills | Status: DC | PRN

## 2020-08-18 MED ORDER — GABAPENTIN 300 MG PO CAPS: 300.0000 mg | ORAL_CAPSULE | Freq: Every day | ORAL | 2 refills | Status: DC

## 2020-08-18 MED ORDER — FLUOXETINE HCL 10 MG PO CAPS
40.0000 mg | ORAL_CAPSULE | Freq: Two times a day (BID) | ORAL | Status: DC
  Administered 2020-08-18: 40 mg via ORAL
  Filled 2020-08-18: qty 4

## 2020-08-18 MED ORDER — BUPROPION HCL ER (XL) 150 MG PO TB24
150.0000 mg | ORAL_TABLET | Freq: Every day | ORAL | Status: DC
  Administered 2020-08-18: 150 mg via ORAL
  Filled 2020-08-18: qty 1

## 2020-08-18 NOTE — Discharge Summary (Signed)
Physician Discharge Summary  Patient ID: Karen Park MRN: [id number] DOB/AGE: 39-Feb-1982 39 y.o.  Admit date: 08/16/2020 Discharge date: 08/18/2020  Admission Diagnoses: menorrhagia and pelvic pain   Discharge Diagnoses:  Active Problems:   Menorrhagia   Post-operative state   Discharged Condition: good  Hospital Course: pt underwent a TAH/ LSO and right salpingectomy   Consults: None  Significant Diagnostic Studies: labs: No results found for this or any previous visit (from the past 24 hour(s)). Results for orders placed or performed during the hospital encounter of 08/16/20 (from the past 72 hour(s))  Pregnancy, urine POC     Status: None   Collection Time: 08/16/20  6:19 AM  Result Value Ref Range   Preg Test, Ur NEGATIVE NEGATIVE    Comment:        THE SENSITIVITY OF THIS METHODOLOGY IS >24 mIU/mL   Type and screen [facility]     Status: None   Collection Time: 08/16/20  6:45 AM  Result Value Ref Range   ABO/RH(D) A POS    Antibody Screen NEG    Sample Expiration      08/19/2020,2359 Performed at [facility], [address] [place], [place] [address]   CBC     Status: Abnormal   Collection Time: 08/16/20 12:12 PM  Result Value Ref Range   WBC 13.0 (H) 4.0 - 10.5 K/uL   RBC 3.48 (L) 3.87 - 5.11 MIL/uL   Hemoglobin 11.5 (L) 12.0 - 15.0 g/dL   HCT 02.4 (L) 09.7 - 35.3 %   MCV 94.3 80.0 - 100.0 fL   MCH 33.0 26.0 - 34.0 pg   MCHC 35.1 30.0 - 36.0 g/dL   RDW 29.9 24.2 - 68.3 %   Platelets 254 150 - 400 K/uL   nRBC 0.0 0.0 - 0.2 %    Comment: Performed at [facility], [address] [place], [place] [address]  Creatinine, serum     Status: None   Collection Time: 08/16/20 12:12 PM  Result Value Ref Range   Creatinine, Ser 0.97 0.44 - 1.00 mg/dL   GFR, Estimated >22 >97 mL/min    Comment: (NOTE) Calculated using the CKD-EPI Creatinine Equation (2021) Performed at [facility], [address]  [place], [place] [address]   CBC     Status: Abnormal   Collection Time: 08/17/20  6:14 AM  Result Value Ref Range   WBC 7.9 4.0 - 10.5 K/uL   RBC 3.35 (L) 3.87 - 5.11 MIL/uL   Hemoglobin 11.0 (L) 12.0 - 15.0 g/dL   HCT 19.4 (L) 17.4 - 08.1 %   MCV 96.7 80.0 - 100.0 fL   MCH 32.8 26.0 - 34.0 pg   MCHC 34.0 30.0 - 36.0 g/dL   RDW 44.8 18.5 - 63.1 %   Platelets 243 150 - 400 K/uL   nRBC 0.0 0.0 - 0.2 %    Comment: Performed at [facility], [address] [place], [place] [address]  Basic metabolic panel     Status: Abnormal   Collection Time: 08/17/20  6:14 AM  Result Value Ref Range   Sodium 130 (L) 135 - 145 mmol/L   Potassium 3.5 3.5 - 5.1 mmol/L   Chloride 102 98 - 111 mmol/L   CO2 25 22 - 32 mmol/L   Glucose, Bld 100 (H) 70 - 99 mg/dL    Comment: Glucose reference range applies only to samples taken after fasting for at least 8 hours.  BUN 7 6 - 20 mg/dL   Creatinine, Ser 3.35 0.44 - 1.00 mg/dL   Calcium 8.3 (L) 8.9 - 10.3 mg/dL   GFR, Estimated >45 >62 mL/min    Comment: (NOTE) Calculated using the CKD-EPI Creatinine Equation (2021)    Anion gap 3 (L) 5 - 15    Comment: Performed at [facility], [address] [place], [place] [address]    Treatments: surgery: as above   Discharge Exam: Blood pressure (!) 145/71, pulse (!) 120, temperature 98.4 F (36.9 C), resp. rate (!) 22, height 5\' 6"  (1.676 m), weight 77.1 kg, last menstrual period 07/30/2020, SpO2 95 %. General appearance: alert and cooperative Resp: clear to auscultation bilaterally Cardio: regular rate and rhythm, S1, S2 normal, no murmur, click, rub or gallop GI: soft, non-tender; bowel sounds normal; no masses,  no organomegaly Incision C/D/I Disposition: Discharge disposition: 01-Home or Self Care      Home  Discharge Instructions    Call MD for:  difficulty breathing, headache or visual disturbances   Complete by: As directed    Call MD for:  extreme fatigue   Complete by:  As directed    Call MD for:  hives   Complete by: As directed    Call MD for:  persistant dizziness or light-headedness   Complete by: As directed    Call MD for:  persistant nausea and vomiting   Complete by: As directed    Call MD for:  redness, tenderness, or signs of infection (pain, swelling, redness, odor or green/yellow discharge around incision site)   Complete by: As directed    Call MD for:  severe uncontrolled pain   Complete by: As directed    Call MD for:  temperature >100.4   Complete by: As directed    Diet - low sodium heart healthy   Complete by: As directed    Increase activity slowly   Complete by: As directed    Leave dressing on - Keep it clean, dry, and intact until clinic visit   Complete by: As directed      Allergies as of 08/18/2020      Reactions   Other Swelling   Blue cheese.      Medication List    TAKE these medications   buPROPion 150 MG 24 hr tablet Commonly known as: WELLBUTRIN XL Take 150 mg by mouth daily.   busPIRone 7.5 MG tablet Commonly known as: BUSPAR Take 7.5 mg by mouth in the morning and at bedtime.   cetirizine 10 MG tablet Commonly known as: ZYRTEC Take 10 mg by mouth at bedtime.   FLUoxetine 40 MG capsule Commonly known as: PROZAC Take 80 mg by mouth daily.   gabapentin 300 MG capsule Commonly known as: Neurontin Take 1 capsule (300 mg total) by mouth at bedtime.   HYDROcodone-acetaminophen 5-325 MG tablet Commonly known as: NORCO/VICODIN Take 1-2 tablets by mouth every 6 (six) hours as needed for moderate pain.   hydrOXYzine 50 MG tablet Commonly known as: ATARAX/VISTARIL Take 50 mg by mouth at bedtime.   montelukast 10 MG tablet Commonly known as: SINGULAIR Take 10 mg by mouth at bedtime.   ondansetron 4 MG tablet Commonly known as: Zofran Take 1 tablet (4 mg total) by mouth every 8 (eight) hours as needed for nausea or vomiting.   traZODone 100 MG tablet Commonly known as: DESYREL Take 100 mg by  mouth at bedtime.  Discharge Care Instructions  (From admission, onward)         Start     Ordered   08/18/20 0000  Leave dressing on - Keep it clean, dry, and intact until clinic visit        08/18/20 0756           Signed: Ihor Austin Advit Trethewey 08/18/2020, 7:59 AM

## 2020-08-18 NOTE — Discharge Instructions (Signed)
Bilateral Salpingo-Oophorectomy, Care After This sheet gives you information about how to care for yourself after your procedure. Your health care provider may also give you more specific instructions. If you have problems or questions, contact your health care provider. What can I expect after the procedure? After the procedure, it is common to have:  Abdominal pain.  Some occasional vaginal bleeding (spotting).  Tiredness.  Symptoms of menopause, such as hot flashes, night sweats, or mood swings. Follow these instructions at home: Incision care  Keep your incision area and your bandage (dressing) clean and dry.  Follow instructions from your health care provider about how to take care of your incision. Make sure you: ? Wash your hands with soap and water before you change your dressing. If soap and water are not available, use hand sanitizer. ? Change your dressing as told by your health care provider. ? Leave stitches (sutures), staples, skin glue, or adhesive strips in place. These skin closures may need to stay in place for 2 weeks or longer. If adhesive strip edges start to loosen and curl up, you may trim the loose edges. Do not remove adhesive strips completely unless your health care provider tells you to do that.  Check your incision area every day for signs of infection. Check for: ? Redness, swelling, or pain. ? Fluid or blood. ? Warmth. ? Pus or a bad smell.   Activity  Do not drive or use heavy machinery while taking prescription pain medicine.  Do not drive for 24 hours if you received a medicine to help you relax (sedative) during your procedure.  Take frequent, short walks throughout the day. Rest when you get tired. Ask your health care provider what activities are safe for you.  Avoid activity that requires great effort. Also, avoid heavy lifting. Do not lift anything that is heavier than 10 lbs. (4.5 kg), or the limit that your health care provider tells you,  until he or she says that it is safe to do so.  Do not douche, use tampons, or have sex until your health care provider approves.   General instructions  To prevent or treat constipation while you are taking prescription pain medicine, your health care provider may recommend that you: ? Drink enough fluid to keep your urine clear or pale yellow. ? Take over-the-counter or prescription medicines. ? Eat foods that are high in fiber, such as fresh fruits and vegetables, whole grains, and beans. ? Limit foods that are high in fat and processed sugars, such as fried and sweet foods.  Take over-the-counter and prescription medicines only as told by your health care provider.  Do not take baths, swim, or use a hot tub until your health care provider approves. Ask your health care provider if you can take showers. You may only be allowed to take sponge baths for bathing.  Wear compression stockings as told by your health care provider. These stockings help to prevent blood clots and reduce swelling in your legs.  Keep all follow-up visits as told by your health care provider. This is important.   Contact a health care provider if:  You have pain when you urinate.  You have pus or a bad smelling discharge coming from your vagina.  You have redness, swelling, or pain around your incision.  You have fluid or blood coming from your incision.  Your incision feels warm to the touch.  You have pus or a bad smell coming from your incision.  You have  a fever.  Your incision starts to break open.  You have pain in the abdomen, and it gets worse or does not get better when you take medicine.  You develop a rash.  You develop nausea and vomiting.  You feel lightheaded. Get help right away if:  You develop pain in your chest or leg.  You become short of breath.  You faint.  You have increased bleeding from your vagina. Summary  After the procedure, it is common to have pain, bleeding  in the vagina, and symptoms of menopause.  Follow instructions from your health care provider about how to take care of your incision.  Follow instructions from your health care provider about activities and restrictions.  Check your incision every day for signs of infection and report any symptoms to your health care provider. This information is not intended to replace advice given to you by your health care provider. Make sure you discuss any questions you have with your health care provider. Document Revised: 05/03/2018 Document Reviewed: 04/03/2016 Elsevier Patient Education  2021 [employer] Abdominal Hysterectomy, Care After The following information offers guidance on how to care for yourself after your procedure. Your health care provider may also give you more specific instructions. If you have problems or questions, contact your health care provider. What can I expect after the procedure? After the procedure, it is common to have:  Pain.  Tiredness (fatigue).  Poor appetite.  Less interest in sex.  Vaginal bleeding and discharge. You may need to use a sanitary napkin after this procedure.  Constipation.  Feelings of sadness or other emotions. Follow these instructions at home: Medicines  Take over-the-counter and prescription medicines only as told by your health care provider.  Do not take aspirin or NSAIDs, such as ibuprofen. These medicines can cause bleeding.  If you were prescribed an antibiotic medicine, take it as told by your health care provider. Do not stop using the antibiotic even if you start to feel better.  Ask your health care provider if the medicine prescribed to you: ? Requires you to avoid driving or using machinery. ? Can cause constipation. You may need to take these actions to prevent or treat constipation:  Drink enough fluid to keep your urine pale yellow.  Take over-the-counter or prescription medicines.  Eat foods that are high in  fiber, such as beans, whole grains, and fresh fruits and vegetables.  Limit foods that are high in fat and processed sugars, such as fried or sweet foods. Incision care  Follow instructions from your health care provider about how to take care of your incision. Make sure you: ? Wash your hands with soap and water for at least 20 seconds before and after you change your bandage (dressing). If soap and water are not available, use hand sanitizer. ? Change your dressing as told by your health care provider. ? Leave stitches (sutures), skin glue, or adhesive strips in place. These skin closures may need to stay in place for 2 weeks or longer. If adhesive strip edges start to loosen and curl up, you may trim the loose edges. Do not remove adhesive strips completely unless your health care provider tells you to do that.  Keep the dressing dry until your health care provider says it can be removed.  Check your incision area every day for signs of infection. Check for: ? More redness, swelling, or pain. ? Fluid or blood. ? Warmth. ? Pus or a bad smell.   Activity  Rest as told by your health care provider.  Avoid sitting for a long time without moving. Get up to take short walks every 1-2 hours. This is important to improve blood flow and breathing. Ask for help if you feel weak or unsteady.  Do not lift anything that is heavier than 10 lb (4.5 kg), or the limit that you are told, until your health care provider says that it is safe.  Follow instructions from your health care provider about exercise, driving, and general activities.  Return to your normal activities as told by your health care provider. Ask your health care provider what activities are safe for you.   Lifestyle  Do not douche, use tampons, or have sex for at least 6 weeks or as told by your health care provider.  Do not drink alcohol until your health care provider approves.  Do not use any products that contain nicotine or  tobacco. These products include cigarettes, chewing tobacco, and vaping devices, such as e-cigarettes. These can delay healing after surgery. If you need help quitting, ask your health care provider. General instructions  If you struggle with physical or emotional changes after your procedure, speak with your health care provider or a therapist.  Do not take baths, swim, or use a hot tub until your health care provider approves. Ask your health care provider if you may take showers. You may only be allowed to take sponge baths.  Try to have a responsible adult at home with you for the first 1-2 weeks to help with your daily chores.  Wear compression stockings as told by your health care provider. These stockings help to prevent blood clots and reduce swelling in your legs.  Keep all follow-up visits. This is important. Contact a health care provider if:  You have any of these signs of infection: ? Chills or a fever. ? More redness, swelling, warmth, or pain around your incision. ? Fluid or blood coming from your incision. ? Pus or a bad smell coming from your incision.  Your incision opens.  You feel dizzy or light-headed.  You have pain or bleeding when you urinate.  You have diarrhea that does not go away.  You have nausea and vomiting that do not go away.  You have pus, or a bad-smelling discharge coming from your vagina.  You have any type of abnormal reaction such as a rash, or you develop an allergy to your medicine.  Your pain medicine does not help. Get help right away if:  You have a fever and your symptoms suddenly get worse.  You have severe pain in your abdomen.  You have shortness of breath.  You faint.  You have pain, swelling, or redness in your leg.  You have heavy vaginal bleeding and blood clots, soaking through a sanitary napkin in less than 1 hour. These symptoms may represent a serious problem that is an emergency. Do not wait to see if the  symptoms will go away. Get medical help right away. Call your local emergency services (911 in the [place] Do not drive yourself to the hospital. Summary  After your procedure, it is common to have pain, tiredness, and vaginal discharge.  Do not lift anything that is heavier than 10 lb (4.5 kg), or the limit that you are told, until your health care provider says that it is safe.  Follow instructions from your health care provider about exercise, driving, and general activities. Ask what activities are safe for you.  Do not take baths, swim, or use a hot tub until your health care provider approves. Ask your health care provider if you may take showers. You may only be allowed to take sponge baths.  Try to have a responsible adult at home with you for the first 1-2 weeks to help with your daily chores. This information is not intended to replace advice given to you by your health care provider. Make sure you discuss any questions you have with your health care provider. Document Revised: 10/30/2019 Document Reviewed: 10/30/2019 Elsevier Patient Education  2021 [employer].

## 2020-08-18 NOTE — Progress Notes (Signed)
Pt shaking and visibly anxious, stating she is also getting a migraine. Pt states she is on 3 different anxiety medications at home. MD notified, meds ordered and given along with pain medication. Will continue to monitor. Florencia Reasons, RN

## 2020-08-18 NOTE — Progress Notes (Signed)
Pt discharged home. Discharge instructions, prescriptions, and follow up appointments given to and reviewed with pt. Pt verbalized understanding. To be escorted by axillary.  

## 2020-08-18 NOTE — Progress Notes (Signed)
Notify Maddie, Rn of vs

## 2020-10-20 ENCOUNTER — Other Ambulatory Visit: Payer: Self-pay

## 2020-10-20 ENCOUNTER — Ambulatory Visit: Admitting: Medical

## 2020-10-20 VITALS — BP 112/78 | HR 62 | Temp 98.8°F | Resp 16 | Wt 186.4 lb

## 2020-10-20 DIAGNOSIS — J0100 Acute maxillary sinusitis, unspecified: Secondary | ICD-10-CM

## 2020-10-20 DIAGNOSIS — H60391 Other infective otitis externa, right ear: Secondary | ICD-10-CM

## 2020-10-20 MED ORDER — NEOMYCIN-POLYMYXIN-HC 3.5-10000-1 OT SOLN: 3.0000 [drp] | Freq: Four times a day (QID) | OTIC | 0 refills | Status: DC

## 2020-10-20 MED ORDER — AMOXICILLIN-POT CLAVULANATE 875-125 MG PO TABS: 1.0000 | ORAL_TABLET | Freq: Two times a day (BID) | ORAL | 0 refills | Status: DC

## 2020-10-20 NOTE — Progress Notes (Signed)
Subjective:    Patient ID: Karen Park, female    DOB: [birth year], 40 y.o.   MRN: [id number]  HPI 40 yo female in non acute distress. Starated  Wednesday with Right ear pain and itching , has woken patient up  the last  2 nights. Some drainage, yellow in color.  Allergies  Allergen Reactions   Other Swelling    Blue cheese.    Maxalt and sumatriptan spray  Review of Systems  Constitutional:  Negative for chills and fever.  HENT:  Positive for congestion, ear discharge, ear pain, sinus pressure and sinus pain. Negative for sneezing and sore throat.   Respiratory:  Negative for cough and shortness of breath.   Cardiovascular:  Negative for chest pain.  Gastrointestinal:  Negative for abdominal pain and diarrhea.  Allergic/Immunologic: Positive for environmental allergies.  Neurological:  Negative for dizziness, syncope, light-headedness and headaches.  Psychiatric/Behavioral:  Positive for sleep disturbance.       Objective:   Physical Exam Vitals and nursing note reviewed.  Constitutional:      Appearance: Normal appearance.  HENT:     Head: Normocephalic and atraumatic.     Jaw: There is normal jaw occlusion.     Right Ear: Hearing and external ear normal. Swelling and tenderness present. A middle ear effusion is present.     Left Ear: Hearing, ear canal and external ear normal. A middle ear effusion is present.     Nose: Congestion present. No rhinorrhea.     Right Turbinates: Enlarged.     Mouth/Throat:     Mouth: Mucous membranes are moist.     Pharynx: Oropharynx is clear.  Eyes:     Extraocular Movements: Extraocular movements intact.     Conjunctiva/sclera: Conjunctivae normal.     Pupils: Pupils are equal, round, and reactive to light.  Cardiovascular:     Rate and Rhythm: Normal rate and regular rhythm.  Pulmonary:     Effort: Pulmonary effort is normal.     Breath sounds: Normal breath sounds.  Musculoskeletal:        General: Normal range of  motion.     Cervical back: Normal range of motion and neck supple.  Lymphadenopathy:     Head:     Right side of head: No submental, submandibular, tonsillar, preauricular, posterior auricular or occipital adenopathy.     Left side of head: No submental, submandibular, tonsillar, preauricular, posterior auricular or occipital adenopathy.     Cervical: No cervical adenopathy.     Right cervical: No superficial, deep or posterior cervical adenopathy.    Left cervical: No superficial, deep or posterior cervical adenopathy.     Upper Body:     Right upper body: No supraclavicular adenopathy.     Left upper body: No supraclavicular adenopathy.  Skin:    General: Skin is warm and dry.  Neurological:     General: No focal deficit present.     Mental Status: She is alert and oriented to person, place, and time.  Psychiatric:        Behavior: Behavior is cooperative.          Assessment & Plan:  Otitis externa right Sinusitis Maxillary Continue allergy medications Meds ordered this encounter  Medications   amoxicillin-clavulanate (AUGMENTIN) 875-125 MG tablet    Sig: Take 1 tablet by mouth 2 (two) times daily.    Dispense:  20 tablet    Refill:  0   neomycin-polymyxin-hydrocortisone (CORTISPORIN) OTIC solution  Sig: Place 3 drops into the right ear 4 (four) times daily. X 5-7 days    Dispense:  10 mL    Refill:  0   Return  in 3-5 days if not improving. Or if worsening.  Patient verbalizes understanding and has no questions at discharge.

## 2020-10-20 NOTE — Patient Instructions (Addendum)
Plain mucinex Nasonex Follow package instructions    Sinusitis, Adult Sinusitis is soreness and swelling (inflammation) of your sinuses. Sinuses are hollow spaces in the bones around your face. They are located: Around your eyes. In the middle of your forehead. Behind your nose. In your cheekbones. Your sinuses and nasal passages are lined with a fluid called mucus. Mucus drains out of your sinuses. Swelling can trap mucus in your sinuses. This lets germs (bacteria, virus, or fungus) grow, which leads to infection. Most of the time, this condition is caused bya virus. What are the causes? This condition is caused by: Allergies. Asthma. Germs. Things that block your nose or sinuses. Growths in the nose (nasal polyps). Chemicals or irritants in the air. Fungus (rare). What increases the risk? You are more likely to develop this condition if: You have a weak body defense system (immune system). You do a lot of swimming or diving. You use nasal sprays too much. You smoke. What are the signs or symptoms? The main symptoms of this condition are pain and a feeling of pressure around the sinuses. Other symptoms include: Stuffy nose (congestion). Runny nose (drainage). Swelling and warmth in the sinuses. Headache. Toothache. A cough that may get worse at night. Mucus that collects in the throat or the back of the nose (postnasal drip). Being unable to smell and taste. Being very tired (fatigue). A fever. Sore throat. Bad breath. How is this diagnosed? This condition is diagnosed based on: Your symptoms. Your medical history. A physical exam. Tests to find out if your condition is short-term (acute) or long-term (chronic). Your doctor may: Check your nose for growths (polyps). Check your sinuses using a tool that has a light (endoscope). Check for allergies or germs. Do imaging tests, such as an MRI or CT scan. How is this treated? Treatment for this condition depends on  the cause and whether it is short-term or long-term. If caused by a virus, your symptoms should go away on their own within 10 days. You may be given medicines to relieve symptoms. They include: Medicines that shrink swollen tissue in the nose. Medicines that treat allergies (antihistamines). A spray that treats swelling of the nostrils.  Rinses that help get rid of thick mucus in your nose (nasal saline washes). If caused by bacteria, your doctor may wait to see if you will get better without treatment. You may be given antibiotic medicine if you have: A very bad infection. A weak body defense system. If caused by growths in the nose, you may need to have surgery. Follow these instructions at home: Medicines Take, use, or apply over-the-counter and prescription medicines only as told by your doctor. These may include nasal sprays. If you were prescribed an antibiotic medicine, take it as told by your doctor. Do not stop taking the antibiotic even if you start to feel better. Hydrate and humidify  Drink enough water to keep your pee (urine) pale yellow. Use a cool mist humidifier to keep the humidity level in your home above 50%. Breathe in steam for 10-15 minutes, 3-4 times a day, or as told by your doctor. You can do this in the bathroom while a hot shower is running. Try not to spend time in cool or dry air.  Rest Rest as much as you can. Sleep with your head raised (elevated). Make sure you get enough sleep each night. General instructions  Put a warm, moist washcloth on your face 3-4 times a day, or as often as  told by your doctor. This will help with discomfort. Wash your hands often with soap and water. If there is no soap and water, use hand sanitizer. Do not smoke. Avoid being around people who are smoking (secondhand smoke). Keep all follow-up visits as told by your doctor. This is important.  Contact a doctor if: You have a fever. Your symptoms get worse. Your symptoms  do not get better within 10 days. Get help right away if: You have a very bad headache. You cannot stop throwing up (vomiting). You have very bad pain or swelling around your face or eyes. You have trouble seeing. You feel confused. Your neck is stiff. You have trouble breathing. Summary Sinusitis is swelling of your sinuses. Sinuses are hollow spaces in the bones around your face. This condition is caused by tissues in your nose that become inflamed or swollen. This traps germs. These can lead to infection. If you were prescribed an antibiotic medicine, take it as told by your doctor. Do not stop taking it even if you start to feel better. Keep all follow-up visits as told by your doctor. This is important. This information is not intended to replace advice given to you by your health care provider. Make sure you discuss any questions you have with your healthcare provider. Document Revised: 07/30/2017 Document Reviewed: 07/30/2017 Elsevier Patient Education  2022 [employer] Inc. Otitis Externa  Otitis externa is an infection of the outer ear canal. The outer ear canal is the area between the outside of the ear and the eardrum. Otitis externa issometimes called swimmer's ear. What are the causes? Common causes of this condition include: Swimming in dirty water. Moisture in the ear. An injury to the inside of the ear. An object stuck in the ear. A cut or scrape on the outside of the ear. What increases the risk? You are more likely to get this condition if you go swimming often. What are the signs or symptoms? Itching in the ear. This is often the first symptom. Swelling of the ear. Redness in the ear. Ear pain. The pain may get worse when you pull on your ear. Pus coming from the ear. How is this treated? This condition may be treated with: Antibiotic ear drops. These are often given for 10-14 days. Medicines to reduce itching and swelling. Follow these instructions at  home: If you were given antibiotic ear drops, use them as told by your doctor. Do not stop using them even if your condition gets better. Take over-the-counter and prescription medicines only as told by your doctor. Avoid getting water in your ears as told by your doctor. You may be told to avoid swimming or water sports for a few days. Keep all follow-up visits as told by your doctor. This is important. How is this prevented? Keep your ears dry. Use the corner of a towel to dry your ears after you swim or bathe. Try not to scratch or put things in your ear. Doing these things makes it easier for germs to grow in your ear. Avoid swimming in lakes, dirty water, or pools that may not have the right amount of a chemical called chlorine. Contact a doctor if: You have a fever. Your ear is still red, swollen, or painful after 3 days. You still have pus coming from your ear after 3 days. Your redness, swelling, or pain gets worse. You have a really bad headache. You have redness, swelling, pain, or tenderness behind your ear. Summary Otitis externa is  an infection of the outer ear canal. Symptoms include pain, redness, and swelling of the ear. If you were given antibiotic ear drops, use them as told by your doctor. Do not stop using them even if your condition gets better. Try not to scratch or put things in your ear. This information is not intended to replace advice given to you by your health care provider. Make sure you discuss any questions you have with your healthcare provider. Document Revised: 08/02/2017 Document Reviewed: 08/03/2017 Elsevier Patient Education  2022 [employer].

## 2020-11-04 ENCOUNTER — Other Ambulatory Visit: Payer: Self-pay

## 2020-11-04 ENCOUNTER — Emergency Department: Payer: BLUE CROSS/BLUE SHIELD

## 2020-11-04 ENCOUNTER — Emergency Department
Admission: EM | Admit: 2020-11-04 | Discharge: 2020-11-04 | Disposition: A | Discharge status: Home / Self Care | Payer: BLUE CROSS/BLUE SHIELD | Attending: Student in an Organized Health Care Education/Training Program | Admitting: Student in an Organized Health Care Education/Training Program

## 2020-11-04 DIAGNOSIS — S0990XA Unspecified injury of head, initial encounter: Secondary | ICD-10-CM

## 2020-11-04 DIAGNOSIS — W01198A Fall on same level from slipping, tripping and stumbling with subsequent striking against other object, initial encounter: Secondary | ICD-10-CM | POA: Insufficient documentation

## 2020-11-04 DIAGNOSIS — Z87891 Personal history of nicotine dependence: Secondary | ICD-10-CM | POA: Insufficient documentation

## 2020-11-04 DIAGNOSIS — Y92009 Unspecified place in unspecified non-institutional (private) residence as the place of occurrence of the external cause: Secondary | ICD-10-CM | POA: Insufficient documentation

## 2020-11-04 LAB — BASIC METABOLIC PANEL
Anion gap: 8 (ref 5–15)
BUN: 14 mg/dL (ref 6–20)
CO2: 26 mmol/L (ref 22–32)
Calcium: 9.4 mg/dL (ref 8.9–10.3)
Chloride: 105 mmol/L (ref 98–111)
Creatinine, Ser: 0.91 mg/dL (ref 0.44–1.00)
GFR, Estimated: >60 mL/min (ref >60)
Glucose, Bld: 94 mg/dL (ref 70–99)
Potassium: 3.9 mmol/L (ref 3.5–5.1)
Sodium: 139 mmol/L (ref 135–145)

## 2020-11-04 LAB — CBC
HCT: 37.6 % (ref 36.0–46.0)
Hemoglobin: 12.6 g/dL (ref 12.0–15.0)
MCH: 31.2 pg (ref 26.0–34.0)
MCHC: 33.5 g/dL (ref 30.0–36.0)
MCV: 93.1 fL (ref 80.0–100.0)
Platelets: 335 10*3/uL (ref 150–400)
RBC: 4.04 MIL/uL (ref 3.87–5.11)
RDW: 12 % (ref 11.5–15.5)
WBC: 6.6 10*3/uL (ref 4.0–10.5)
nRBC: 0 % (ref 0.0–0.2)

## 2020-11-04 MED ORDER — KETOROLAC TROMETHAMINE 30 MG/ML IJ SOLN
30.0000 mg | Freq: Once | INTRAMUSCULAR | Status: AC
  Administered 2020-11-04: 30 mg via INTRAMUSCULAR
  Filled 2020-11-04: qty 1

## 2020-11-04 MED ORDER — ONDANSETRON 4 MG PO TBDP
ORAL_TABLET | ORAL | Status: AC
  Administered 2020-11-04: 4 mg via ORAL
  Filled 2020-11-04: qty 1

## 2020-11-04 MED ORDER — ONDANSETRON 4 MG PO TBDP
4.0000 mg | ORAL_TABLET | Freq: Three times a day (TID) | ORAL | 0 refills | Status: DC | PRN
Start: 2020-11-04 — End: 2021-02-13

## 2020-11-04 MED ORDER — METOCLOPRAMIDE HCL 10 MG PO TABS
10.0000 mg | ORAL_TABLET | Freq: Once | ORAL | Status: AC
  Administered 2020-11-04: 10 mg via ORAL
  Filled 2020-11-04: qty 1

## 2020-11-04 MED ORDER — BUTALBITAL-APAP-CAFFEINE 50-325-40 MG PO TABS
1.0000 | ORAL_TABLET | Freq: Once | ORAL | Status: AC
  Administered 2020-11-04: 1 via ORAL
  Filled 2020-11-04: qty 1

## 2020-11-04 MED ORDER — BUTALBITAL-APAP-CAFFEINE 50-325-40 MG PO TABS: 1.0000 | ORAL_TABLET | Freq: Four times a day (QID) | ORAL | 0 refills | Status: AC | PRN

## 2020-11-04 MED ORDER — ONDANSETRON 4 MG PO TBDP: 4.0000 mg | ORAL_TABLET | Freq: Once | ORAL | Status: AC

## 2020-11-04 NOTE — ED Provider Notes (Signed)
[facility] Emergency Department Provider Note    Event Date/Time   First MD Initiated Contact with Patient 11/04/20 1312     (approximate)  I have reviewed the triage vital signs and the nursing notes.   HISTORY  Chief Complaint Fall    HPI Karen Park is a 40 y.o. female below listed past medical history presents to the ER for evaluation of headache as well as nausea x3 after she struck her head on the desk earlier this morning.  Cannot recall if she lost consciousness but felt significantly dazed.  No numbness or tingling.  Is having photophobia.  Does have a history of migraines this is different as it is frontal.  Does not take anything for pain.  Was seen at walk-in clinic and directed to the ER for further evaluation.  Denies any neck pain or other injury.  Past Medical History:  Diagnosis Date   Anxiety    Arthritis    both knees, fingers, spine   Depression    History of hiatal hernia    repair 2020   History of kidney stones    Migraine    Pneumonia    No family history on file. Past Surgical History:  Procedure Laterality Date   CESAREAN SECTION     CHOLECYSTECTOMY     COLONOSCOPY     dental work     ESOPHAGOGASTRODUODENOSCOPY     exploratory laparoscopic     GASTRIC BYPASS     HERNIA REPAIR     x2   HYSTERECTOMY ABDOMINAL WITH SALPINGECTOMY Bilateral 08/16/2020   Procedure: HYSTERECTOMY ABDOMINAL WITH BILATERAL SALPINGECTOMY  (L) OOPHORECTOMY;  Surgeon: Suzy Bouchard, MD;  Location: [facility] ORS;  Service: Gynecology;  Laterality: Bilateral;   OOPHORECTOMY Left 08/16/2020   Procedure: OOPHORECTOMY;  Surgeon: Schermerhorn, Ihor Austin, MD;  Location: [facility] ORS;  Service: Gynecology;  Laterality: Left;   Patient Active Problem List   Diagnosis Date Noted   Menorrhagia 08/16/2020   Post-operative state 08/16/2020      Prior to Admission medications   Medication Sig Start Date End Date Taking? Authorizing Provider   butalbital-acetaminophen-caffeine (FIORICET) 50-325-40 MG tablet Take 1 tablet by mouth every 6 (six) hours as needed for headache. 11/04/20 11/04/21 Yes Willy Eddy, MD  ondansetron (ZOFRAN ODT) 4 MG disintegrating tablet Take 1 tablet (4 mg total) by mouth every 8 (eight) hours as needed for nausea or vomiting. 11/04/20  Yes Willy Eddy, MD  amoxicillin-clavulanate (AUGMENTIN) 875-125 MG tablet Take 1 tablet by mouth 2 (two) times daily. 10/20/20   Ratcliffe, Heather R, PA-C  buPROPion (WELLBUTRIN XL) 150 MG 24 hr tablet Take 150 mg by mouth daily. 01/07/20   [provider]  busPIRone (BUSPAR) 7.5 MG tablet Take 7.5 mg by mouth in the morning and at bedtime. 04/29/19   [provider]  cetirizine (ZYRTEC) 10 MG tablet Take 10 mg by mouth at bedtime. 07/16/19   [provider]  FLUoxetine (PROZAC) 40 MG capsule Take 80 mg by mouth daily. 05/27/19   [provider]  gabapentin (NEURONTIN) 300 MG capsule Take 1 capsule (300 mg total) by mouth at bedtime. Patient not taking: Reported on 10/20/2020 08/18/20 08/18/21  Schermerhorn, Ihor Austin, MD  HYDROcodone-acetaminophen (NORCO/VICODIN) 5-325 MG tablet Take 1-2 tablets by mouth every 6 (six) hours as needed for moderate pain. Patient not taking: Reported on 10/20/2020 08/18/20 08/18/21  Schermerhorn, Ihor Austin, MD  hydrOXYzine (ATARAX/VISTARIL) 50 MG tablet Take 50 mg by mouth at  bedtime. 06/19/19   [provider]  montelukast (SINGULAIR) 10 MG tablet Take 10 mg by mouth at bedtime.    [provider]  neomycin-polymyxin-hydrocortisone (CORTISPORIN) OTIC solution Place 3 drops into the right ear 4 (four) times daily. X 5-7 days 10/20/20   Ellie Lunch R, PA-C  ondansetron (ZOFRAN) 4 MG tablet Take 1 tablet (4 mg total) by mouth every 8 (eight) hours as needed for nausea or vomiting. 08/18/20   Schermerhorn, Ihor Austin, MD  traZODone (DESYREL) 100 MG tablet Take 100 mg by mouth at bedtime. 03/14/20    [provider]    Allergies Other    Social History Social History   Tobacco Use   Smoking status: Former    Types: Cigarettes    Quit date: 01/17/2020    Years since quitting: 0.8   Smokeless tobacco: Never  Vaping Use   Vaping Use: Every day   Substances: Flavoring  Substance Use Topics   Alcohol use: Not Currently    Alcohol/week: 2.0 standard drinks    Types: 1 Glasses of wine, 1 Cans of beer per week    Comment: rarely   Drug use: Not Currently    Review of Systems Patient denies headaches, rhinorrhea, blurry vision, numbness, shortness of breath, chest pain, edema, cough, abdominal pain, nausea, vomiting, diarrhea, dysuria, fevers, rashes or hallucinations unless otherwise stated above in HPI. ____________________________________________   PHYSICAL EXAM:  VITAL SIGNS: Vitals:   11/04/20 1150 11/04/20 1355  BP: (!) 119/58 116/63  Pulse: 67 64  Resp: 16 17  Temp: 98.6 F (37 C)   SpO2: 99% 100%    Constitutional: Alert and oriented.  Eyes: Conjunctivae are normal.  Head: Atraumatic. Nose: No congestion/rhinnorhea. Mouth/Throat: Mucous membranes are moist.   Neck: No stridor. Painless ROM.  Cardiovascular: Normal rate, regular rhythm. Grossly normal heart sounds.  Good peripheral circulation. Respiratory: Normal respiratory effort.  No retractions. Lungs CTAB. Gastrointestinal: Soft and nontender. No distention. No abdominal bruits. No CVA tenderness. Genitourinary:  Musculoskeletal: No lower extremity tenderness nor edema.  No joint effusions. Neurologic:  Normal speech and language. No gross focal neurologic deficits are appreciated. No facial droop Skin:  Skin is warm, dry and intact. No rash noted. Psychiatric: Mood and affect are normal. Speech and behavior are normal.  ____________________________________________   LABS (all labs ordered are listed, but only abnormal results are displayed)  Results for orders placed or performed  during the hospital encounter of 11/04/20 (from the past 24 hour(s))  Basic metabolic panel     Status: None   Collection Time: 11/04/20 11:53 AM  Result Value Ref Range   Sodium 139 135 - 145 mmol/L   Potassium 3.9 3.5 - 5.1 mmol/L   Chloride 105 98 - 111 mmol/L   CO2 26 22 - 32 mmol/L   Glucose, Bld 94 70 - 99 mg/dL   BUN 14 6 - 20 mg/dL   Creatinine, Ser 4.78 0.44 - 1.00 mg/dL   Calcium 9.4 8.9 - 29.5 mg/dL   GFR, Estimated >62 >13 mL/min   Anion gap 8 5 - 15  CBC     Status: None   Collection Time: 11/04/20 11:53 AM  Result Value Ref Range   WBC 6.6 4.0 - 10.5 K/uL   RBC 4.04 3.87 - 5.11 MIL/uL   Hemoglobin 12.6 12.0 - 15.0 g/dL   HCT 08.6 57.8 - 46.9 %   MCV 93.1 80.0 - 100.0 fL   MCH 31.2 26.0 - 34.0 pg  MCHC 33.5 30.0 - 36.0 g/dL   RDW 32.1 22.4 - 82.5 %   Platelets 335 150 - 400 K/uL   nRBC 0.0 0.0 - 0.2 %   ____________________________________________ ____________________________________________  RADIOLOGY  I personally reviewed all radiographic images ordered to evaluate for the above acute complaints and reviewed radiology reports and findings.  These findings were personally discussed with the patient.  Please see medical record for radiology report.  ____________________________________________   PROCEDURES  Procedure(s) performed:  Procedures    Critical Care performed: no ____________________________________________   INITIAL IMPRESSION / ASSESSMENT AND PLAN / ED COURSE  Pertinent labs & imaging results that were available during my care of the patient were reviewed by me and considered in my medical decision making (see chart for details).   DDX: Concussion, SDH, IPH, fracture, contusion  [address] Maisie Park is a 40 y.o. who presents to the ED with presentation as described above.  Patient with minor head injury but having multiple episodes of vomiting after.  Having photophobia as well.  CT imaging ordered for above differential fortunately is  reassuring.  No focal neurodeficits.  Patient's pain improved with oral medication.  Patient is stable and appropriate for outpatient follow-up.  We discussed strict return precautions.  Patient agreeable to plan.     The patient was evaluated in Emergency Department today for the symptoms described in the history of present illness. He/she was evaluated in the context of the global COVID-19 pandemic, which necessitated consideration that the patient might be at risk for infection with the SARS-CoV-2 virus that causes COVID-19. Institutional protocols and algorithms that pertain to the evaluation of patients at risk for COVID-19 are in a state of rapid change based on information released by regulatory bodies including the CDC and federal and state organizations. These policies and algorithms were followed during the patient's care in the ED.  As part of my medical decision making, I reviewed the following data within the electronic MEDICAL RECORD NUMBER Nursing notes reviewed and incorporated, Labs reviewed, notes from prior ED visits and [place] Controlled Substance Database   ____________________________________________   FINAL CLINICAL IMPRESSION(S) / ED DIAGNOSES  Final diagnoses:  Minor head injury, initial encounter      NEW MEDICATIONS STARTED DURING THIS VISIT:  New Prescriptions   BUTALBITAL-ACETAMINOPHEN-CAFFEINE (FIORICET) 50-325-40 MG TABLET    Take 1 tablet by mouth every 6 (six) hours as needed for headache.   ONDANSETRON (ZOFRAN ODT) 4 MG DISINTEGRATING TABLET    Take 1 tablet (4 mg total) by mouth every 8 (eight) hours as needed for nausea or vomiting.     Note:  This document was prepared using Dragon voice recognition software and may include unintentional dictation errors.    Willy Eddy, MD 11/04/20 1538

## 2020-11-04 NOTE — ED Notes (Signed)
See triage note - pt reports bending down to pick something up and hitting her forehead on a desk. Denies LOC.

## 2020-11-04 NOTE — ED Notes (Signed)
Pt aware we need urine sample, denies any chance of pregnancy due to hysterectomy in June of this year.

## 2020-11-04 NOTE — ED Notes (Signed)
Pt wheeled to bathroom and then to lobby to wait for ride

## 2020-11-04 NOTE — ED Triage Notes (Signed)
Pt to ER via [facility] after falling from standing and hitting her head on her desk this morning. Unsure if LOC. Denies blood thinner usage.   Reports multiple episodes of emesis after, nausea dizziness and sensitivity to light.

## 2020-11-04 NOTE — ED Notes (Signed)
Pt here via EMS from primary care Dr, fell and hit her head at home this am, VSS, anxious, 126/81, 80, 98%; c/o dizziness, blurred vision, emesis X2.

## 2021-02-09 ENCOUNTER — Emergency Department (EMERGENCY_DEPARTMENT_HOSPITAL)
Admission: EM | Admit: 2021-02-09 | Discharge: 2021-02-10 | Disposition: A | Discharge status: Other Acute Inpatient Hospital | Payer: BLUE CROSS/BLUE SHIELD | Source: Home / Self Care | Attending: Emergency Medicine | Admitting: Emergency Medicine

## 2021-02-09 ENCOUNTER — Other Ambulatory Visit: Payer: Self-pay

## 2021-02-09 DIAGNOSIS — F331 Major depressive disorder, recurrent, moderate: Secondary | ICD-10-CM | POA: Diagnosis present

## 2021-02-09 DIAGNOSIS — Z20822 Contact with and (suspected) exposure to covid-19: Secondary | ICD-10-CM | POA: Insufficient documentation

## 2021-02-09 DIAGNOSIS — F23 Brief psychotic disorder: Secondary | ICD-10-CM | POA: Insufficient documentation

## 2021-02-09 DIAGNOSIS — Y905 Blood alcohol level of 100-119 mg/100 ml: Secondary | ICD-10-CM | POA: Insufficient documentation

## 2021-02-09 DIAGNOSIS — F332 Major depressive disorder, recurrent severe without psychotic features: Secondary | ICD-10-CM | POA: Diagnosis not present

## 2021-02-09 DIAGNOSIS — F1010 Alcohol abuse, uncomplicated: Secondary | ICD-10-CM | POA: Diagnosis present

## 2021-02-09 DIAGNOSIS — Z87891 Personal history of nicotine dependence: Secondary | ICD-10-CM | POA: Insufficient documentation

## 2021-02-09 LAB — POC URINE PREG, ED: Preg Test, Ur: NEGATIVE

## 2021-02-09 LAB — CBC
HCT: 37.6 % (ref 36.0–46.0)
Hemoglobin: 12.3 g/dL (ref 12.0–15.0)
MCH: 30.7 pg (ref 26.0–34.0)
MCHC: 32.7 g/dL (ref 30.0–36.0)
MCV: 93.8 fL (ref 80.0–100.0)
Platelets: 373 10*3/uL (ref 150–400)
RBC: 4.01 MIL/uL (ref 3.87–5.11)
RDW: 11.9 % (ref 11.5–15.5)
WBC: 6.3 10*3/uL (ref 4.0–10.5)
nRBC: 0 % (ref 0.0–0.2)

## 2021-02-09 LAB — BASIC METABOLIC PANEL
Anion gap: 8 (ref 5–15)
BUN: 7 mg/dL (ref 6–20)
CO2: 25 mmol/L (ref 22–32)
Calcium: 9 mg/dL (ref 8.9–10.3)
Chloride: 101 mmol/L (ref 98–111)
Creatinine, Ser: 0.78 mg/dL (ref 0.44–1.00)
GFR, Estimated: >60 mL/min (ref >60)
Glucose, Bld: 94 mg/dL (ref 70–99)
Potassium: 3.4 mmol/L — ABNORMAL LOW (ref 3.5–5.1)
Sodium: 134 mmol/L — ABNORMAL LOW (ref 135–145)

## 2021-02-09 LAB — ETHANOL: Alcohol, Ethyl (B): 117 mg/dL — ABNORMAL HIGH (ref <10)

## 2021-02-09 LAB — SALICYLATE LEVEL: Salicylate Lvl: <7 mg/dL — ABNORMAL LOW (ref 7.0–30.0)

## 2021-02-09 LAB — ACETAMINOPHEN LEVEL: Acetaminophen (Tylenol), Serum: <10 ug/mL — ABNORMAL LOW (ref 10–30)

## 2021-02-09 LAB — RESP PANEL BY RT-PCR (FLU A&B, COVID) ARPGX2
Influenza A by PCR: NEGATIVE
Influenza B by PCR: NEGATIVE
SARS Coronavirus 2 by RT PCR: NEGATIVE

## 2021-02-09 MED ORDER — LORAZEPAM 2 MG/ML IJ SOLN: 0.0000 mg | Freq: Four times a day (QID) | INTRAMUSCULAR | Status: DC

## 2021-02-09 MED ORDER — LORAZEPAM 2 MG PO TABS
0.0000 mg | ORAL_TABLET | Freq: Four times a day (QID) | ORAL | Status: DC
  Administered 2021-02-09: 2 mg via ORAL
  Filled 2021-02-09: qty 1

## 2021-02-09 MED ORDER — LORAZEPAM 2 MG/ML IJ SOLN: 0.0000 mg | Freq: Two times a day (BID) | INTRAMUSCULAR | Status: DC

## 2021-02-09 MED ORDER — LORAZEPAM 2 MG PO TABS: 0.0000 mg | ORAL_TABLET | Freq: Two times a day (BID) | ORAL | Status: DC

## 2021-02-09 MED ORDER — THIAMINE HCL 100 MG PO TABS
100.0000 mg | ORAL_TABLET | Freq: Every day | ORAL | Status: DC
  Administered 2021-02-10: 100 mg via ORAL
  Filled 2021-02-09: qty 1

## 2021-02-09 MED ORDER — THIAMINE HCL 100 MG/ML IJ SOLN: 100.0000 mg | Freq: Every day | INTRAMUSCULAR | Status: DC

## 2021-02-09 NOTE — ED Provider Notes (Signed)
[facility] Emergency Department Provider Note   ____________________________________________   Event Date/Time   First MD Initiated Contact with Patient 02/09/21 2208     (approximate)  I have reviewed the triage vital signs and the nursing notes.   HISTORY  Chief Complaint Mental Health Problem    HPI Karen Park is a 40 y.o. female reports a history of depression and anxiety currently treated by a psychiatrist in [place]  Patient reports that she has had a couple of alcoholic drinks then just not quite sure what happened she started to reminisce upon her brother who died in [place].  She decided to walk out of the house and was going to walk to the airport in [place] to catch a flight to [place].  However she reports that her brother in [place] has since died.   She is also going to walk to [place] to get a flight but tells me she did not have any money to buy take it.  She also does report that she is using alcohol tonight she is a fair amount and had talked to "a couple of people" about some of her symptoms and they were concerned about her calling her 911  Involuntary commitment paperwork reviewed as well.  She denies suicidal or homicidal ideation at this time, but also acknowledges that she feels like she is severely depressed and needs a mental health evaluation.  She reports this sort of thing has happened to her before and she suffered severe depression and takes medication for it  Past Medical History:  Diagnosis Date   Anxiety    Arthritis    both knees, fingers, spine   Depression    History of hiatal hernia    repair 2020   History of kidney stones    Migraine    Pneumonia     Patient Active Problem List   Diagnosis Date Noted   Menorrhagia 08/16/2020   Post-operative state 08/16/2020    Past Surgical History:  Procedure Laterality Date   CESAREAN SECTION     CHOLECYSTECTOMY     COLONOSCOPY      dental work     ESOPHAGOGASTRODUODENOSCOPY     exploratory laparoscopic     GASTRIC BYPASS     HERNIA REPAIR     x2   HYSTERECTOMY ABDOMINAL WITH SALPINGECTOMY Bilateral 08/16/2020   Procedure: HYSTERECTOMY ABDOMINAL WITH BILATERAL SALPINGECTOMY  (L) OOPHORECTOMY;  Surgeon: Suzy Bouchard, MD;  Location: [facility] ORS;  Service: Gynecology;  Laterality: Bilateral;   OOPHORECTOMY Left 08/16/2020   Procedure: OOPHORECTOMY;  Surgeon: Schermerhorn, Ihor Austin, MD;  Location: [facility] ORS;  Service: Gynecology;  Laterality: Left;    Prior to Admission medications   Medication Sig Start Date End Date Taking? Authorizing Provider  amoxicillin-clavulanate (AUGMENTIN) 875-125 MG tablet Take 1 tablet by mouth 2 (two) times daily. 10/20/20   Ratcliffe, Heather R, PA-C  buPROPion (WELLBUTRIN XL) 150 MG 24 hr tablet Take 150 mg by mouth daily. 01/07/20   [provider]  busPIRone (BUSPAR) 7.5 MG tablet Take 7.5 mg by mouth in the morning and at bedtime. 04/29/19   [provider]  butalbital-acetaminophen-caffeine (FIORICET) 50-325-40 MG tablet Take 1 tablet by mouth every 6 (six) hours as needed for headache. 11/04/20 11/04/21  Willy Eddy, MD  cetirizine (ZYRTEC) 10 MG tablet Take 10 mg by mouth at bedtime. 07/16/19   [provider]  FLUoxetine (PROZAC) 40 MG capsule Take 80 mg by mouth daily. 05/27/19  [provider]  gabapentin (NEURONTIN) 300 MG capsule Take 1 capsule (300 mg total) by mouth at bedtime. Patient not taking: Reported on 10/20/2020 08/18/20 08/18/21  Schermerhorn, Gwen Her, MD  HYDROcodone-acetaminophen (NORCO/VICODIN) 5-325 MG tablet Take 1-2 tablets by mouth every 6 (six) hours as needed for moderate pain. Patient not taking: Reported on 10/20/2020 08/18/20 08/18/21  Schermerhorn, Gwen Her, MD  hydrOXYzine (ATARAX/VISTARIL) 50 MG tablet Take 50 mg by mouth at bedtime. 06/19/19   [provider]  montelukast (SINGULAIR) 10 MG tablet Take 10 mg by mouth at  bedtime.    [provider]  neomycin-polymyxin-hydrocortisone (CORTISPORIN) OTIC solution Place 3 drops into the right ear 4 (four) times daily. X 5-7 days 10/20/20   Daryll Drown R, PA-C  ondansetron (ZOFRAN ODT) 4 MG disintegrating tablet Take 1 tablet (4 mg total) by mouth every 8 (eight) hours as needed for nausea or vomiting. 11/04/20   Merlyn Lot, MD  ondansetron (ZOFRAN) 4 MG tablet Take 1 tablet (4 mg total) by mouth every 8 (eight) hours as needed for nausea or vomiting. 08/18/20   Schermerhorn, Gwen Her, MD  traZODone (DESYREL) 100 MG tablet Take 100 mg by mouth at bedtime. 03/14/20   [provider]    Allergies Other  History reviewed. No pertinent family history.  Social History Social History   Tobacco Use   Smoking status: Former    Types: Cigarettes    Quit date: 01/17/2020    Years since quitting: 1.0   Smokeless tobacco: Never  Vaping Use   Vaping Use: Every day   Substances: Flavoring  Substance Use Topics   Alcohol use: Not Currently    Alcohol/week: 2.0 standard drinks    Types: 1 Glasses of wine, 1 Cans of beer per week    Comment: rarely   Drug use: Not Currently    Review of Systems Constitutional: No fever/chills or recent medical illness Eyes: No visual changes. Cardiovascular: Denies chest pain. Respiratory: Denies shortness of breath. Gastrointestinal: No abdominal pain.  Denies pregnancy reports previous hysterectomy Genitourinary: Negative for dysuria. Neurological: Negative for headaches or weakness. Psychiatric: Denies suicidal intent or desire to harm others.  Speaks of her brother's homelessness and death in [place] and reports she is reminiscing on this and feels like she is sort of zoned out on it  Social: Denies drug use Does use alcohol regularly, a few drinks today  ____________________________________________   PHYSICAL EXAM:  VITAL SIGNS: ED Triage Vitals [02/09/21 2210]  Enc Vitals Group     BP       Pulse      Resp      Temp      Temp src      SpO2      Weight 180 lb (81.6 kg)     Height 5\' 6"  (1.676 m)     Head Circumference      Peak Flow      Pain Score      Pain Loc      Pain Edu?      Excl. in [place]?     Constitutional: Alert and oriented. Well appearing and in no acute distress.  Somewhat sedate very calm and somewhat little bit slow in her mannerisms.  Smiles frequently Eyes: Conjunctivae are slightly injected bilateral Head: Atraumatic. Nose: No congestion/rhinnorhea. Mouth/Throat: Mucous membranes are moist. Neck: No stridor.  Cardiovascular: Normal rate, regular rhythm. Grossly normal heart sounds.  Good peripheral circulation. Respiratory: Normal respiratory effort.  No retractions. Lungs  CTAB. Gastrointestinal: Soft and nontender. No distention. Musculoskeletal: No lower extremity tenderness nor edema. Neurologic:  Normal speech and language. No gross focal neurologic deficits are appreciated.  Skin:  Skin is warm, dry and intact. No rash noted. Psychiatric: Mood and affect are normal. Speech and behavior are normal.  ____________________________________________   LABS (all labs ordered are listed, but only abnormal results are displayed)  Labs Reviewed  RESP PANEL BY RT-PCR (FLU A&B, COVID) ARPGX2  CBC  BASIC METABOLIC PANEL  ETHANOL  SALICYLATE LEVEL  ACETAMINOPHEN LEVEL  POC URINE PREG, ED  POC URINE PREG, ED   ____________________________________________  EKG   ____________________________________________  RADIOLOGY   ____________________________________________   PROCEDURES  Procedure(s) performed: None  Procedures  Critical Care performed: No  ____________________________________________   INITIAL IMPRESSION / ASSESSMENT AND PLAN / ED COURSE  Pertinent labs & imaging results that were available during my care of the patient were reviewed by me and considered in my medical decision making (see chart for details).   On  patient presents under IVC, will maintain her under so.  Consult to psychiatry.  Denies any acute medical symptoms but does report long history of depressive symptoms and treatment by practitioner in [place]  She does report use of alcohol today and gives a story that somewhat parallels that of the IVC paperwork in particular around symptoms wanting to go to [place] and also planning to walk to get on an airplane.  However she also describes a story and history it seems somewhat unrealistic in her expectation that she would walk to the airport and somehow catch a flight with no money to [place].  Suspect she is suffering from some element of psychosis possibly secondary to severe depression, possibly related to alcohol, or other cause.  There is no obvious medical etiology based on clinical exam.  Labs pending at this time.  Ongoing care assigned to Dr. Charna Archer.  Patient under IVC, awaiting psychiatry consult but at this point all of her medical screening labs are still pending and will require follow-up        ____________________________________________   FINAL CLINICAL IMPRESSION(S) / ED DIAGNOSES  Final diagnoses:  Acute psychosis ([place])        Note:  This document was prepared using Dragon voice recognition software and may include unintentional dictation errors       Delman Kitten, MD 02/09/21 2313

## 2021-02-09 NOTE — ED Triage Notes (Signed)
Pt came from home, iVC from husband. Pt was drinking 6 trulys alcoholic drinks and wanted to walk from her house to the airport and stated she wanted to go visit her dead brother who passed away 4 yrs ago in [place]. Pt says she has failed her brother and just wants to visit him.  Pt husband concerned and IVC her.   Pt has locked box items in the [employer] security witnessed  1 watch 1 bracelet 1 necklace 3 rings 10 earrings   Went into locked security personal belonging box/envelope.  Pt belonging in white belonging bags are  1 black jacket 1 black purse 1 sweater 1 pink bra 2 white socks 1 black pants 1 red underwear 1 red shirt 1 pink e cigarette 1 chapstick 2 black shoes  1 waterbottle  Witnessed by [employer] NT

## 2021-02-10 ENCOUNTER — Inpatient Hospital Stay
Admission: AD | Admit: 2021-02-10 | Discharge: 2021-02-13 | DRG: 885 | Disposition: A | Discharge status: Home / Self Care | Payer: BLUE CROSS/BLUE SHIELD | Source: Intra-hospital | Attending: Psychiatry | Admitting: Psychiatry

## 2021-02-10 ENCOUNTER — Encounter: Payer: Self-pay | Admitting: Psychiatry

## 2021-02-10 DIAGNOSIS — Z20822 Contact with and (suspected) exposure to covid-19: Secondary | ICD-10-CM | POA: Diagnosis present

## 2021-02-10 DIAGNOSIS — Z90710 Acquired absence of both cervix and uterus: Secondary | ICD-10-CM

## 2021-02-10 DIAGNOSIS — F332 Major depressive disorder, recurrent severe without psychotic features: Principal | ICD-10-CM | POA: Diagnosis present

## 2021-02-10 DIAGNOSIS — F1010 Alcohol abuse, uncomplicated: Secondary | ICD-10-CM | POA: Diagnosis present

## 2021-02-10 DIAGNOSIS — Z87442 Personal history of urinary calculi: Secondary | ICD-10-CM | POA: Diagnosis not present

## 2021-02-10 DIAGNOSIS — Z79899 Other long term (current) drug therapy: Secondary | ICD-10-CM

## 2021-02-10 DIAGNOSIS — Z90721 Acquired absence of ovaries, unilateral: Secondary | ICD-10-CM | POA: Diagnosis not present

## 2021-02-10 DIAGNOSIS — R45851 Suicidal ideations: Secondary | ICD-10-CM | POA: Diagnosis present

## 2021-02-10 DIAGNOSIS — Z9884 Bariatric surgery status: Secondary | ICD-10-CM

## 2021-02-10 DIAGNOSIS — R4587 Impulsiveness: Secondary | ICD-10-CM | POA: Diagnosis present

## 2021-02-10 DIAGNOSIS — F419 Anxiety disorder, unspecified: Secondary | ICD-10-CM | POA: Diagnosis present

## 2021-02-10 DIAGNOSIS — F17210 Nicotine dependence, cigarettes, uncomplicated: Secondary | ICD-10-CM | POA: Diagnosis present

## 2021-02-10 DIAGNOSIS — G4700 Insomnia, unspecified: Secondary | ICD-10-CM | POA: Diagnosis present

## 2021-02-10 DIAGNOSIS — F331 Major depressive disorder, recurrent, moderate: Secondary | ICD-10-CM

## 2021-02-10 MED ORDER — ACETAMINOPHEN 325 MG PO TABS
650.0000 mg | ORAL_TABLET | Freq: Four times a day (QID) | ORAL | Status: DC | PRN
  Administered 2021-02-10 – 2021-02-12 (×3): 650 mg via ORAL
  Filled 2021-02-10 (×3): qty 2

## 2021-02-10 MED ORDER — MAGNESIUM HYDROXIDE 400 MG/5ML PO SUSP: 30.0000 mL | Freq: Every day | ORAL | Status: DC | PRN

## 2021-02-10 MED ORDER — NICOTINE 14 MG/24HR TD PT24
14.0000 mg | MEDICATED_PATCH | Freq: Every day | TRANSDERMAL | Status: DC
  Administered 2021-02-10 – 2021-02-13 (×4): 14 mg via TRANSDERMAL
  Filled 2021-02-10 (×4): qty 1

## 2021-02-10 MED ORDER — LORAZEPAM 2 MG/ML IJ SOLN: 0.0000 mg | Freq: Two times a day (BID) | INTRAMUSCULAR | Status: DC

## 2021-02-10 MED ORDER — LORAZEPAM 2 MG PO TABS: 0.0000 mg | ORAL_TABLET | Freq: Two times a day (BID) | ORAL | Status: DC

## 2021-02-10 MED ORDER — THIAMINE HCL 100 MG/ML IJ SOLN: 100.0000 mg | Freq: Every day | INTRAMUSCULAR | Status: DC

## 2021-02-10 MED ORDER — LORAZEPAM 2 MG/ML IJ SOLN
0.0000 mg | Freq: Four times a day (QID) | INTRAMUSCULAR | Status: DC
Start: 2021-02-10 — End: 2021-02-11

## 2021-02-10 MED ORDER — LORAZEPAM 2 MG PO TABS
0.0000 mg | ORAL_TABLET | Freq: Four times a day (QID) | ORAL | Status: DC
Start: 2021-02-10 — End: 2021-02-11
  Administered 2021-02-10: 1 mg via ORAL
  Filled 2021-02-10: qty 1

## 2021-02-10 MED ORDER — ALUM & MAG HYDROXIDE-SIMETH 200-200-20 MG/5ML PO SUSP: 30.0000 mL | ORAL | Status: DC | PRN

## 2021-02-10 MED ORDER — THIAMINE HCL 100 MG PO TABS
100.0000 mg | ORAL_TABLET | Freq: Every day | ORAL | Status: DC
  Administered 2021-02-11 – 2021-02-13 (×3): 100 mg via ORAL
  Filled 2021-02-10 (×3): qty 1

## 2021-02-10 NOTE — BH Assessment (Addendum)
Comprehensive Clinical Assessment (CCA) Note  02/10/2021 [facility] Marcello Moores [id number]  Chief Complaint: Patient is a 40 year old female presenting to [facility] ED under IVC. Per triage note Pt came from home, iVC from husband. Pt was drinking 6 trulys alcoholic drinks and wanted to walk from her house to the airport and stated she wanted to go visit her dead brother who passed away 4 yrs ago in [place]. Pt says she has failed her brother and just wants to visit him. Pt husband concerned and IVC her. During assessment patient appears alert and oriented x4, calm and cooperative. Patient reports "I was just trying to walk down the street." Patient reports that she moved from [place] to [place] due to "my husband retired from [employer]." Patient reports that since moving she has been in contact with her therapist back in [place]. Patient reports that she has a diagnosis of Depression and has had mental health treatment in the past "in [place]. around 2017 for depression." Patient reports that she is not on any current medications and is currently working. Patient's BAL is 117. Patient denies SI/HI/AH/VH and does not appear to be responding to any internal or external stimuli.  Per Psyc NP Ysidro Evert patient to be reassessed Chief Complaint  Patient presents with   Mental Health Problem   Visit Diagnosis: Depression, Alcohol abuse    CCA Screening, Triage and Referral (STR)  Patient Reported Information How did you hear about [place]? Legal System  Referral name: No data recorded Referral phone number: No data recorded  Whom do you see for routine medical problems? No data recorded Practice/Facility Name: No data recorded Practice/Facility Phone Number: No data recorded Name of Contact: No data recorded Contact Number: No data recorded Contact Fax Number: No data recorded Prescriber Name: No data recorded Prescriber Address (if known): No data recorded  What Is the Reason for Your Visit/Call Today?  Patient presents under IVC after leaving her home under the influence to walk to the airport  How Long Has This Been Causing You Problems? <Week  What Do You Feel Would Help You the Most Today? No data recorded  Have You Recently Been in Any Inpatient Treatment (Hospital/Detox/Crisis Center/28-Day Program)? No data recorded Name/Location of Program/Hospital:No data recorded How Long Were You There? No data recorded When Were You Discharged? No data recorded  Have You Ever Received Services From [facility] Before? No data recorded Who Do You See at [facility]? No data recorded  Have You Recently Had Any Thoughts About Hurting Yourself? No  Are You Planning to Commit Suicide/Harm Yourself At This time? No   Have you Recently Had Thoughts About [place]? No  Explanation: No data recorded  Have You Used Any Alcohol or Drugs in the Past 24 Hours? Yes  How Long Ago Did You Use Drugs or Alcohol? No data recorded What Did You Use and How Much? 6 Trulys   Do You Currently Have a Therapist/Psychiatrist? No  Name of Therapist/Psychiatrist: No data recorded  Have You Been Recently Discharged From Any Office Practice or Programs? No  Explanation of Discharge From Practice/Program: No data recorded    CCA Screening Triage Referral Assessment Type of Contact: Face-to-Face  Is this Initial or Reassessment? No data recorded Date Telepsych consult ordered in [facility]:  No data recorded Time Telepsych consult ordered in [facility]:  No data recorded  Patient Reported Information Reviewed? No data recorded Patient Left Without Being Seen? No data recorded Reason for Not  Completing Assessment: No data recorded  Collateral Involvement: No data recorded  Does Patient Have a [place]? No data recorded Name and Contact of Legal Guardian: No data recorded If Minor and Not Living with Parent(s), Who has Custody? No data recorded Is CPS involved or ever been  involved? Never  Is APS involved or ever been involved? Never   Patient Determined To Be At Risk for Harm To Self or Others Based on Review of Patient Reported Information or Presenting Complaint? No  Method: No data recorded Availability of Means: No data recorded Intent: No data recorded Notification Required: No data recorded Additional Information for Danger to Others Potential: No data recorded Additional Comments for Danger to Others Potential: No data recorded Are There Guns or Other Weapons in Your Home? No data recorded Types of Guns/Weapons: No data recorded Are These Weapons Safely Secured?                            No data recorded Who Could Verify You Are Able To Have These Secured: No data recorded Do You Have any Outstanding Charges, Pending Court Dates, Parole/Probation? No data recorded Contacted To Inform of Risk of Harm To Self or Others: No data recorded  Location of Assessment: [facility] ED   Does Patient Present under Involuntary Commitment? Yes  IVC Papers Initial File Date: 02/09/21   [place] of Residence: [place]   Patient Currently Receiving the Following Services: No data recorded  Determination of Need: Emergent (2 hours)   Options For Referral: No data recorded    CCA Biopsychosocial Intake/Chief Complaint:  No data recorded Current Symptoms/Problems: No data recorded  Patient Reported Schizophrenia/Schizoaffective Diagnosis in Past: No   Strengths: Patient is able to communicate her needs  Preferences: No data recorded Abilities: No data recorded  Type of Services Patient Feels are Needed: No data recorded  Initial Clinical Notes/Concerns: No data recorded  Mental Health Symptoms Depression:   Change in energy/activity; Difficulty Concentrating; Fatigue   Duration of Depressive symptoms:  Greater than two weeks   Mania:   None   Anxiety:    None   Psychosis:   None   Duration of Psychotic symptoms: No data recorded   Trauma:   None   Obsessions:   None   Compulsions:   None   Inattention:   None   Hyperactivity/Impulsivity:   None   Oppositional/Defiant Behaviors:   None   Emotional Irregularity:   None   Other Mood/Personality Symptoms:  No data recorded   Mental Status Exam Appearance and self-care  Stature:   Average   Weight:   Average weight   Clothing:   Casual   Grooming:   Normal   Cosmetic use:   None   Posture/gait:   Normal   Motor activity:  No data recorded  Sensorium  Attention:   Normal   Concentration:   Normal   Orientation:   X5   Recall/memory:   Normal   Affect and Mood  Affect:   Appropriate   Mood:   Depressed   Relating  Eye contact:   Normal   Facial expression:   Responsive   Attitude toward examiner:   Cooperative   Thought and Language  Speech flow:  Clear and Coherent   Thought content:   Appropriate to Mood and Circumstances   Preoccupation:   None   Hallucinations:   None   Organization:  No data  recorded  [employer] of Knowledge:   Fair   Intelligence:   Average   Abstraction:   Normal   Judgement:   Fair   Programmer, systems   Insight:   Fair   Decision Making:   Normal   Social Functioning  Social Maturity:   Responsible   Social Judgement:   Normal   Stress  Stressors:   Other (Comment)   Coping Ability:   Normal   Skill Deficits:   None   Supports:   Family     Religion: Religion/Spirituality Are You A Religious Person?: No  Leisure/Recreation: Leisure / Recreation Do You Have Hobbies?: No  Exercise/Diet: Exercise/Diet Do You Exercise?: No Have You Gained or Lost A Significant Amount of Weight in the Past Six Months?: No Do You Follow a Special Diet?: No Do You Have Any Trouble Sleeping?: No   CCA Employment/Education Employment/Work Situation: Employment / Work Situation Employment Situation: Employed Work Stressors:  None reported Patient's Job has Been Impacted by Current Illness: No Has Patient ever Been in Equities trader?: No  Education: Education Is Patient Currently Attending School?: No Did You Have An Individualized Education Program (IIEP): No Did You Have Any Difficulty At [employer]?: No Patient's Education Has Been Impacted by Current Illness: No   CCA Family/Childhood History Family and Relationship History: Family history Marital status: Married What types of issues is patient dealing with in the relationship?: None reported Additional relationship information: None reported Does patient have children?: Yes How many children?: 1 How is patient's relationship with their children?: Good relationship with her son  Childhood History:  Childhood History Did patient suffer any verbal/emotional/physical/sexual abuse as a child?: No Did patient suffer from severe childhood neglect?: No Has patient ever been sexually abused/assaulted/raped as an adolescent or adult?: No Was the patient ever a victim of a crime or a disaster?: No Witnessed domestic violence?: No Has patient been affected by domestic violence as an adult?: No  Child/Adolescent Assessment:     CCA Substance Use Alcohol/Drug Use: Alcohol / Drug Use Pain Medications: See MAR Prescriptions: See MAR Over the Counter: See MAR History of alcohol / drug use?: Yes Substance #1 Name of Substance 1: Alcohol 1 - Amount (size/oz): "6 Trulys" 1 - Last Use / Amount: 02/09/21                       ASAM's:  Six Dimensions of Multidimensional Assessment  Dimension 1:  Acute Intoxication and/or Withdrawal Potential:      Dimension 2:  Biomedical Conditions and Complications:      Dimension 3:  Emotional, Behavioral, or Cognitive Conditions and Complications:     Dimension 4:  Readiness to Change:     Dimension 5:  Relapse, Continued use, or Continued Problem Potential:     Dimension 6:  Recovery/Living Environment:      ASAM Severity Score:    ASAM Recommended Level of Treatment:     Substance use Disorder (SUD)    Recommendations for Services/Supports/Treatments:    DSM5 Diagnoses: Patient Active Problem List   Diagnosis Date Noted   MDD (major depressive disorder), recurrent episode, moderate (HCC) 02/10/2021   Alcohol abuse 02/10/2021   Menorrhagia 08/16/2020   Post-operative state 08/16/2020    Patient Centered Plan: Patient is on the following Treatment Plan(s):  Depression and Substance Abuse   Referrals to Alternative Service(s): Referred to Alternative Service(s):   Place:   Date:   Time:  Referred to Alternative Service(s):   Place:   Date:   Time:    Referred to Alternative Service(s):   Place:   Date:   Time:    Referred to Alternative Service(s):   Place:   Date:   Time:     Karen Park, LCAS-A

## 2021-02-10 NOTE — ED Provider Notes (Signed)
Emergency Medicine Observation Re-evaluation Note  Karen Park is a 40 y.o. female, seen on rounds today.  Pt initially presented to the ED for complaints of Mental Health Problem   Physical Exam  BP 126/76 (BP Location: Right Arm)   Pulse 91   Temp 98.7 F (37.1 C) (Oral)   Resp 16   Ht 5\' 6"  (1.676 m)   Wt 81.6 kg   LMP 07/30/2020 (Approximate)   SpO2 97%   BMI 29.05 kg/m  Physical Exam General: nad Cardiac: well perfused Lungs: unlabored Psych: currently calm  ED Course / MDM  EKG:   I have reviewed the labs performed to date as well as medications administered while in observation.  Recent changes in the last 24 hours include none.  Plan  Current plan is for psych dispo. [facility] 08/01/2020 is under involuntary commitment.      Maisie Fus, MD 02/10/21 [id number]

## 2021-02-10 NOTE — ED Notes (Signed)
lunch tray given. 

## 2021-02-10 NOTE — Consult Note (Signed)
[facility] Face-to-Face Psychiatry Consult   Reason for Consult: Mental Health Problem Referring Physician: Dr. Fanny Bien Patient Identification: Karen Park MRN:  [id number] Principal Diagnosis: <principal problem not specified> Diagnosis:  Active Problems:   MDD (major depressive disorder), recurrent episode, moderate (HCC)   Alcohol abuse   Total Time spent with patient: 45 minutes  Subjective: "I have just recently started drinking." Karen Park [address] Karen Park is a 40 y.o. female patient presented to [facility] ED via EMS from home is placed under involuntary commitment status (IVC) by her husband. It was reported that the patient had been drinking and wanted to walk to the airport to get to [place] to visit her brother's grave. The patient BAL is 117 mg/dl. The patient had been drinking six truly alcoholic drinks and wanted to walk from her house to the airport and stated she wanted to visit her dead brother, who passed away 4 yrs ago in [place], [place]. The patient voiced she has failed her brother and wants to see him.  The patient shared that she sees a therapist. The patient shared she was last hospitalized for her mental illness in 2017, and it was in [place]. She shared they recently moved to [place] a little over a year. She discussed her husband retired from [employer]. The patient admits working at [employer], and she and her husband share one ten-year-old son. The patient was seen face-to-face by this provider; the chart was reviewed and consulted with Dr.Jessup on 02/10/2021 due to the patient's care. It was discussed with the EDP that the patient remained under observation overnight and will be reassessed in the a.m. to determine if she meets the criteria for psychiatric inpatient admission; she could be discharged home. On evaluation, the patient is slow to arouse but alert and oriented x 4, calm, cooperative, and mood-congruent with affect. The patient does not appear to be responding to internal or  external stimuli. Neither is the patient presenting with any delusional thinking. The patient denies auditory or visual hallucinations. The patient denies any suicidal, homicidal, or self-harm ideations. The patient is not presenting with any psychotic or paranoid behaviors.   HPI: Per Dr. Fanny Bien, [address] Karen Park is a 40 y.o. female reports a history of depression and anxiety currently treated by a psychiatrist in [place]  Patient reports that she has had a couple of alcoholic drinks then just not quite sure what happened she started to reminisce upon her brother who died in [place].  She decided to walk out of the house and was going to walk to the airport in [place] to catch a flight to [place].  However she reports that her brother in [place] has since died.     She is also going to walk to [place] to get a flight but tells me she did not have any money to buy take it.  She also does report that she is using alcohol tonight she is a fair amount and had talked to "a couple of people" about some of her symptoms and they were concerned about her calling her 911   Involuntary commitment paperwork reviewed as well.   She denies suicidal or homicidal ideation at this time, but also acknowledges that she feels like she is severely depressed and needs a mental health evaluation.  She reports this sort of thing has happened to her before and she suffered severe depression and takes medication for it  Past Psychiatric History:  Anxiety Depression Migraine  Risk to Self:  Risk to Others:   Prior Inpatient Therapy:   Prior Outpatient Therapy:    Past Medical History:  Past Medical History:  Diagnosis Date   Anxiety    Arthritis    both knees, fingers, spine   Depression    History of hiatal hernia    repair 2020   History of kidney stones    Migraine    Pneumonia     Past Surgical History:  Procedure Laterality Date   CESAREAN SECTION     CHOLECYSTECTOMY      COLONOSCOPY     dental work     ESOPHAGOGASTRODUODENOSCOPY     exploratory laparoscopic     GASTRIC BYPASS     HERNIA REPAIR     x2   HYSTERECTOMY ABDOMINAL WITH SALPINGECTOMY Bilateral 08/16/2020   Procedure: HYSTERECTOMY ABDOMINAL WITH BILATERAL SALPINGECTOMY  (L) OOPHORECTOMY;  Surgeon: Suzy Bouchard, MD;  Location: [facility] ORS;  Service: Gynecology;  Laterality: Bilateral;   OOPHORECTOMY Left 08/16/2020   Procedure: OOPHORECTOMY;  Surgeon: Schermerhorn, Ihor Austin, MD;  Location: [facility] ORS;  Service: Gynecology;  Laterality: Left;   Family History: History reviewed. No pertinent family history. Family Psychiatric  History:  Social History:  Social History   Substance and Sexual Activity  Alcohol Use Not Currently   Alcohol/week: 2.0 standard drinks   Types: 1 Glasses of wine, 1 Cans of beer per week   Comment: rarely     Social History   Substance and Sexual Activity  Drug Use Not Currently    Social History   Socioeconomic History   Marital status: Married    Spouse name: Karen Park   Number of children: 1   Years of education: Not on file   Highest education level: Not on file  Occupational History   Not on file  Tobacco Use   Smoking status: Former    Types: Cigarettes    Quit date: 01/17/2020    Years since quitting: 1.0   Smokeless tobacco: Never  Vaping Use   Vaping Use: Every day   Substances: Flavoring  Substance and Sexual Activity   Alcohol use: Not Currently    Alcohol/week: 2.0 standard drinks    Types: 1 Glasses of wine, 1 Cans of beer per week    Comment: rarely   Drug use: Not Currently   Sexual activity: Not on file  Other Topics Concern   Not on file  Social History Narrative   Not on file   Social Determinants of Health   Financial Resource Strain: Not on file  Food Insecurity: Not on file  Transportation Needs: Not on file  Physical Activity: Not on file  Stress: Not on file  Social Connections: Not on file   Additional Social  History:    Allergies:   Allergies  Allergen Reactions   Other Swelling    Blue cheese.    Labs:  Results for orders placed or performed during the hospital encounter of 02/09/21 (from the past 48 hour(s))  POC urine preg, ED (not at [facility])     Status: None   Collection Time: 02/09/21  9:53 PM  Result Value Ref Range   Preg Test, Ur NEGATIVE NEGATIVE    Comment:        THE SENSITIVITY OF THIS METHODOLOGY IS >24 mIU/mL   Resp Panel by RT-PCR (Flu A&B, Covid) Nasopharyngeal Swab     Status: None   Collection Time: 02/09/21 10:45 PM   Specimen: Nasopharyngeal Swab; Nasopharyngeal(NP) swabs in vial transport medium  Result Value Ref Range   SARS Coronavirus 2 by RT PCR NEGATIVE NEGATIVE    Comment: (NOTE) SARS-CoV-2 target nucleic acids are NOT DETECTED.  The SARS-CoV-2 RNA is generally detectable in upper respiratory specimens during the acute phase of infection. The lowest concentration of SARS-CoV-2 viral copies this assay can detect is 138 copies/mL. A negative result does not preclude SARS-Cov-2 infection and should not be used as the sole basis for treatment or other patient management decisions. A negative result may occur with  improper specimen collection/handling, submission of specimen other than nasopharyngeal swab, presence of viral mutation(s) within the areas targeted by this assay, and inadequate number of viral copies(<138 copies/mL). A negative result must be combined with clinical observations, patient history, and epidemiological information. The expected result is Negative.  Fact Sheet for Patients:  BloggerCourse.com  Fact Sheet for Healthcare Providers:  SeriousBroker.it  This test is no t yet approved or cleared by the [place] FDA and  has been authorized for detection and/or diagnosis of SARS-CoV-2 by FDA under an Emergency Use Authorization (EUA). This EUA will remain  in effect (meaning this  test can be used) for the duration of the COVID-19 declaration under Section 564(b)(1) of the Act, 21 U.S.C.section 360bbb-3(b)(1), unless the authorization is terminated  or revoked sooner.       Influenza A by PCR NEGATIVE NEGATIVE   Influenza B by PCR NEGATIVE NEGATIVE    Comment: (NOTE) The Xpert Xpress SARS-CoV-2/FLU/RSV plus assay is intended as an aid in the diagnosis of influenza from Nasopharyngeal swab specimens and should not be used as a sole basis for treatment. Nasal washings and aspirates are unacceptable for Xpert Xpress SARS-CoV-2/FLU/RSV testing.  Fact Sheet for Patients: BloggerCourse.com  Fact Sheet for Healthcare Providers: SeriousBroker.it  This test is not yet approved or cleared by the [place] FDA and has been authorized for detection and/or diagnosis of SARS-CoV-2 by FDA under an Emergency Use Authorization (EUA). This EUA will remain in effect (meaning this test can be used) for the duration of the COVID-19 declaration under Section 564(b)(1) of the Act, 21 U.S.C. section 360bbb-3(b)(1), unless the authorization is terminated or revoked.  Performed at [facility], [address] [place], [place] [address]   CBC     Status: None   Collection Time: 02/09/21 10:45 PM  Result Value Ref Range   WBC 6.3 4.0 - 10.5 K/uL   RBC 4.01 3.87 - 5.11 MIL/uL   Hemoglobin 12.3 12.0 - 15.0 g/dL   HCT 20.9 47.0 - 96.2 %   MCV 93.8 80.0 - 100.0 fL   MCH 30.7 26.0 - 34.0 pg   MCHC 32.7 30.0 - 36.0 g/dL   RDW 83.6 62.9 - 47.6 %   Platelets 373 150 - 400 K/uL   nRBC 0.0 0.0 - 0.2 %    Comment: Performed at [facility], [address] [place], [place] [address]  Basic metabolic panel     Status: Abnormal   Collection Time: 02/09/21 10:45 PM  Result Value Ref Range   Sodium 134 (L) 135 - 145 mmol/L   Potassium 3.4 (L) 3.5 - 5.1 mmol/L   Chloride 101 98 - 111 mmol/L   CO2 25 22 - 32  mmol/L   Glucose, Bld 94 70 - 99 mg/dL    Comment: Glucose reference range applies only to samples taken after fasting for at least 8 hours.   BUN 7 6 - 20 mg/dL   Creatinine, Ser 3.54 0.44 -  1.00 mg/dL   Calcium 9.0 8.9 - 21.3 mg/dL   GFR, Estimated >08 >65 mL/min    Comment: (NOTE) Calculated using the CKD-EPI Creatinine Equation (2021)    Anion gap 8 5 - 15    Comment: Performed at [facility], [address] [place], [place] [address]  Ethanol     Status: Abnormal   Collection Time: 02/09/21 10:45 PM  Result Value Ref Range   Alcohol, Ethyl (B) 117 (H) <10 mg/dL    Comment: (NOTE) Lowest detectable limit for serum alcohol is 10 mg/dL.  For medical purposes only. Performed at [facility], [address] [place], [place] [address]   Salicylate level     Status: Abnormal   Collection Time: 02/09/21 10:45 PM  Result Value Ref Range   Salicylate Lvl <7.0 (L) 7.0 - 30.0 mg/dL    Comment: Performed at [facility], [address] [place], [place] [address]  Acetaminophen level     Status: Abnormal   Collection Time: 02/09/21 10:45 PM  Result Value Ref Range   Acetaminophen (Tylenol), Serum <10 (L) 10 - 30 ug/mL    Comment: (NOTE) Therapeutic concentrations vary significantly. A range of 10-30 ug/mL  may be an effective concentration for many patients. However, some  are best treated at concentrations outside of this range. Acetaminophen concentrations >150 ug/mL at 4 hours after ingestion  and >50 ug/mL at 12 hours after ingestion are often associated with  toxic reactions.  Performed at [facility], [address], [place], [place] [address]     Current Facility-Administered Medications  Medication Dose Route Frequency Provider Last Rate Last Admin   LORazepam (ATIVAN) injection 0-4 mg  0-4 mg Intravenous Q6H Sharyn Creamer, MD       Or   LORazepam (ATIVAN) tablet 0-4 mg  0-4 mg Oral Q6H Sharyn Creamer, MD   2 mg at 02/09/21 2246    [START ON 02/12/2021] LORazepam (ATIVAN) injection 0-4 mg  0-4 mg Intravenous Q12H Sharyn Creamer, MD       Or   Melene Muller ON 02/12/2021] LORazepam (ATIVAN) tablet 0-4 mg  0-4 mg Oral Q12H Sharyn Creamer, MD       thiamine tablet 100 mg  100 mg Oral Daily Sharyn Creamer, MD       Or   thiamine (B-1) injection 100 mg  100 mg Intravenous Daily Sharyn Creamer, MD       Current Outpatient Medications  Medication Sig Dispense Refill   amoxicillin-clavulanate (AUGMENTIN) 875-125 MG tablet Take 1 tablet by mouth 2 (two) times daily. 20 tablet 0   buPROPion (WELLBUTRIN XL) 150 MG 24 hr tablet Take 150 mg by mouth daily.     busPIRone (BUSPAR) 7.5 MG tablet Take 7.5 mg by mouth in the morning and at bedtime.     butalbital-acetaminophen-caffeine (FIORICET) 50-325-40 MG tablet Take 1 tablet by mouth every 6 (six) hours as needed for headache. 14 tablet 0   cetirizine (ZYRTEC) 10 MG tablet Take 10 mg by mouth at bedtime.     FLUoxetine (PROZAC) 40 MG capsule Take 80 mg by mouth daily.     gabapentin (NEURONTIN) 300 MG capsule Take 1 capsule (300 mg total) by mouth at bedtime. (Patient not taking: Reported on 10/20/2020) 10 capsule 2   HYDROcodone-acetaminophen (NORCO/VICODIN) 5-325 MG tablet Take 1-2 tablets by mouth every 6 (six) hours as needed for moderate pain. (Patient not taking: Reported on 10/20/2020) 20 tablet 0   hydrOXYzine (ATARAX/VISTARIL) 50 MG tablet  Take 50 mg by mouth at bedtime.     montelukast (SINGULAIR) 10 MG tablet Take 10 mg by mouth at bedtime.     neomycin-polymyxin-hydrocortisone (CORTISPORIN) OTIC solution Place 3 drops into the right ear 4 (four) times daily. X 5-7 days 10 mL 0   ondansetron (ZOFRAN ODT) 4 MG disintegrating tablet Take 1 tablet (4 mg total) by mouth every 8 (eight) hours as needed for nausea or vomiting. 20 tablet 0   ondansetron (ZOFRAN) 4 MG tablet Take 1 tablet (4 mg total) by mouth every 8 (eight) hours as needed for nausea or vomiting. 20 tablet 0   traZODone (DESYREL)  100 MG tablet Take 100 mg by mouth at bedtime.      Musculoskeletal: Strength & Muscle Tone: within normal limits Gait & Station: normal Patient leans: N/A  Psychiatric Specialty Exam:  Presentation  General Appearance: Appropriate for Environment  Eye Contact:Fair  Speech:Slow  Speech Volume:Decreased  Handedness:Right   Mood and Affect  Mood:Depressed  Affect:Depressed; Flat; Blunt; Congruent   Thought Process  Thought Processes:Coherent  Descriptions of Associations:Intact  Orientation:Full (Time, Place and Person)  Thought Content:Logical  History of Schizophrenia/Schizoaffective disorder:No data recorded Duration of Psychotic Symptoms:No data recorded Hallucinations:Hallucinations: None  Ideas of Reference:None  Suicidal Thoughts:Suicidal Thoughts: No  Homicidal Thoughts:Homicidal Thoughts: No   Sensorium  Memory:Immediate Fair; Recent Fair; Remote Fair  Judgment:Fair  Insight:Lacking   Executive Functions  Concentration:Fair  Attention Span:Fair  Recall:Fair  Fund of Knowledge:Fair  Language:Fair   Psychomotor Activity  Psychomotor Activity:Psychomotor Activity: Normal   Assets  Assets:Communication Skills; Desire for Improvement; Resilience; Social Support   Sleep  Sleep:Sleep: Good   Physical Exam: Physical Exam Vitals and nursing note reviewed.  Constitutional:      Appearance: Normal appearance.  HENT:     Head: Normocephalic and atraumatic.     Nose: Nose normal.  Cardiovascular:     Rate and Rhythm: Normal rate.     Pulses: Normal pulses.  Pulmonary:     Effort: Pulmonary effort is normal.  Musculoskeletal:        General: Normal range of motion.     Cervical back: Normal range of motion and neck supple.  Neurological:     General: No focal deficit present.     Mental Status: She is alert and oriented to person, place, and time.  Psychiatric:        Attention and Perception: Attention and perception  normal.        Mood and Affect: Mood and affect normal.        Speech: Speech is delayed.        Behavior: Behavior normal. Behavior is cooperative.        Thought Content: Thought content normal.        Cognition and Memory: Cognition and memory normal.        Judgment: Judgment normal.   Review of Systems  Psychiatric/Behavioral:  Positive for depression. The patient is nervous/anxious.   All other systems reviewed and are negative. Blood pressure 108/70, pulse 86, temperature 98.6 F (37 C), temperature source Oral, height 5\' 6"  (1.676 m), weight 81.6 kg, last menstrual period 07/30/2020, SpO2 99 %. Body mass index is 29.05 kg/m.  Treatment Plan Summary: Daily contact with patient to assess and evaluate symptoms and progress in treatment and Plan The patient remained under observation overnight and will be reassessed in the a.m. to determine if she meets the criteria for psychiatric inpatient admission; she could be discharged home.  Disposition: Supportive therapy provided about ongoing stressors. The patient remained under observation overnight and will be reassessed in the a.m. to determine if she meets the criteria for psychiatric inpatient admission; she could be discharged home.  Gillermo Murdoch, NP 02/10/2021 2:32 AM

## 2021-02-10 NOTE — Consult Note (Signed)
[facility] Face-to-Face Psychiatry Consult   Reason for Consult:  depression, re-assessment Referring Physician:  EDP Patient Identification: Karen Park MRN:  [id number] Principal Diagnosis: <principal problem not specified> Diagnosis:  Active Problems:   MDD (major depressive disorder), recurrent episode, moderate (HCC)   Alcohol abuse   Total Time spent with patient: 20 minutes  Subjective:   Karen Park is a 40 y.o. female patient admitted with alcohol intoxication and depression.   HPI:  See previous note. IVC paperwork reviewed. Chart reviewed. Patient discloses that she did take an overdose of some kind of pills in Sept 2022, but said she through them up. She confirms that her brother completed suicide and she has a lot of guilt feelings about it. She endorses some passive SI all the time, but is drinking more and impulsive. She doesn't think her antidepressants are working as well. Having difficulty finding a provider in this area. Patient had bariatric bypass surgery 3 years ago.    Collateral from husband, Ethelene Browns [phone]): Husband states that he feels that patient needs to be admitted. He has concern that she will try to harm herself. He states patient told him that she did "try to kill herself" but she would not tell him what she did. They have no support in area and he works full-time. He states that when patient left last night she took her bottle of Trazodone and he felt she would overdose. She has been drinking more alcohol.    Past Psychiatric History: Anxiety and depression  Risk to Self:   Risk to Others:   Prior Inpatient Therapy:   Prior Outpatient Therapy:    Past Medical History:  Past Medical History:  Diagnosis Date   Anxiety    Arthritis    both knees, fingers, spine   Depression    History of hiatal hernia    repair 2020   History of kidney stones    Migraine    Pneumonia     Past Surgical History:  Procedure Laterality Date   CESAREAN  SECTION     CHOLECYSTECTOMY     COLONOSCOPY     dental work     ESOPHAGOGASTRODUODENOSCOPY     exploratory laparoscopic     GASTRIC BYPASS     HERNIA REPAIR     x2   HYSTERECTOMY ABDOMINAL WITH SALPINGECTOMY Bilateral 08/16/2020   Procedure: HYSTERECTOMY ABDOMINAL WITH BILATERAL SALPINGECTOMY  (L) OOPHORECTOMY;  Surgeon: Suzy Bouchard, MD;  Location: [facility] ORS;  Service: Gynecology;  Laterality: Bilateral;   OOPHORECTOMY Left 08/16/2020   Procedure: OOPHORECTOMY;  Surgeon: Schermerhorn, Ihor Austin, MD;  Location: [facility] ORS;  Service: Gynecology;  Laterality: Left;   Family History: History reviewed. No pertinent family history. Family Psychiatric  History: Patient's brother completed suicide per IVC paperwork Social History:  Social History   Substance and Sexual Activity  Alcohol Use Not Currently   Alcohol/week: 2.0 standard drinks   Types: 1 Glasses of wine, 1 Cans of beer per week   Comment: rarely     Social History   Substance and Sexual Activity  Drug Use Not Currently    Social History   Socioeconomic History   Marital status: Married    Spouse name: Ethelene Browns   Number of children: 1   Years of education: Not on file   Highest education level: Not on file  Occupational History   Not on file  Tobacco Use   Smoking status: Former    Types: Cigarettes    Quit  date: 01/17/2020    Years since quitting: 1.0   Smokeless tobacco: Never  Vaping Use   Vaping Use: Every day   Substances: Flavoring  Substance and Sexual Activity   Alcohol use: Not Currently    Alcohol/week: 2.0 standard drinks    Types: 1 Glasses of wine, 1 Cans of beer per week    Comment: rarely   Drug use: Not Currently   Sexual activity: Not on file  Other Topics Concern   Not on file  Social History Narrative   Not on file   Social Determinants of Health   Financial Resource Strain: Not on file  Food Insecurity: Not on file  Transportation Needs: Not on file  Physical Activity: Not on  file  Stress: Not on file  Social Connections: Not on file   Additional Social History:    Allergies:   Allergies  Allergen Reactions   Other Swelling    Blue cheese.    Labs:  Results for orders placed or performed during the hospital encounter of 02/09/21 (from the past 48 hour(s))  POC urine preg, ED (not at [facility])     Status: None   Collection Time: 02/09/21  9:53 PM  Result Value Ref Range   Preg Test, Ur NEGATIVE NEGATIVE    Comment:        THE SENSITIVITY OF THIS METHODOLOGY IS >24 mIU/mL   Resp Panel by RT-PCR (Flu A&B, Covid) Nasopharyngeal Swab     Status: None   Collection Time: 02/09/21 10:45 PM   Specimen: Nasopharyngeal Swab; Nasopharyngeal(NP) swabs in vial transport medium  Result Value Ref Range   SARS Coronavirus 2 by RT PCR NEGATIVE NEGATIVE    Comment: (NOTE) SARS-CoV-2 target nucleic acids are NOT DETECTED.  The SARS-CoV-2 RNA is generally detectable in upper respiratory specimens during the acute phase of infection. The lowest concentration of SARS-CoV-2 viral copies this assay can detect is 138 copies/mL. A negative result does not preclude SARS-Cov-2 infection and should not be used as the sole basis for treatment or other patient management decisions. A negative result may occur with  improper specimen collection/handling, submission of specimen other than nasopharyngeal swab, presence of viral mutation(s) within the areas targeted by this assay, and inadequate number of viral copies(<138 copies/mL). A negative result must be combined with clinical observations, patient history, and epidemiological information. The expected result is Negative.  Fact Sheet for Patients:  BloggerCourse.com  Fact Sheet for Healthcare Providers:  SeriousBroker.it  This test is no t yet approved or cleared by the [place] FDA and  has been authorized for detection and/or diagnosis of SARS-CoV-2 by FDA under an  Emergency Use Authorization (EUA). This EUA will remain  in effect (meaning this test can be used) for the duration of the COVID-19 declaration under Section 564(b)(1) of the Act, 21 U.S.C.section 360bbb-3(b)(1), unless the authorization is terminated  or revoked sooner.       Influenza A by PCR NEGATIVE NEGATIVE   Influenza B by PCR NEGATIVE NEGATIVE    Comment: (NOTE) The Xpert Xpress SARS-CoV-2/FLU/RSV plus assay is intended as an aid in the diagnosis of influenza from Nasopharyngeal swab specimens and should not be used as a sole basis for treatment. Nasal washings and aspirates are unacceptable for Xpert Xpress SARS-CoV-2/FLU/RSV testing.  Fact Sheet for Patients: BloggerCourse.com  Fact Sheet for Healthcare Providers: SeriousBroker.it  This test is not yet approved or cleared by the [place] FDA and has been authorized for detection and/or diagnosis of  SARS-CoV-2 by FDA under an Emergency Use Authorization (EUA). This EUA will remain in effect (meaning this test can be used) for the duration of the COVID-19 declaration under Section 564(b)(1) of the Act, 21 U.S.C. section 360bbb-3(b)(1), unless the authorization is terminated or revoked.  Performed at [facility], [address] [place], [place] [address]   CBC     Status: None   Collection Time: 02/09/21 10:45 PM  Result Value Ref Range   WBC 6.3 4.0 - 10.5 K/uL   RBC 4.01 3.87 - 5.11 MIL/uL   Hemoglobin 12.3 12.0 - 15.0 g/dL   HCT 16.0 73.7 - 10.6 %   MCV 93.8 80.0 - 100.0 fL   MCH 30.7 26.0 - 34.0 pg   MCHC 32.7 30.0 - 36.0 g/dL   RDW 26.9 48.5 - 46.2 %   Platelets 373 150 - 400 K/uL   nRBC 0.0 0.0 - 0.2 %    Comment: Performed at [facility], [address] [place], [place] [address]  Basic metabolic panel     Status: Abnormal   Collection Time: 02/09/21 10:45 PM  Result Value Ref Range   Sodium 134 (L) 135 - 145 mmol/L    Potassium 3.4 (L) 3.5 - 5.1 mmol/L   Chloride 101 98 - 111 mmol/L   CO2 25 22 - 32 mmol/L   Glucose, Bld 94 70 - 99 mg/dL    Comment: Glucose reference range applies only to samples taken after fasting for at least 8 hours.   BUN 7 6 - 20 mg/dL   Creatinine, Ser 0.93 0.44 - 1.00 mg/dL   Calcium 9.0 8.9 - 81.8 mg/dL   GFR, Estimated >29 >93 mL/min    Comment: (NOTE) Calculated using the CKD-EPI Creatinine Equation (2021)    Anion gap 8 5 - 15    Comment: Performed at [facility], [address] [place], [place] [address]  Ethanol     Status: Abnormal   Collection Time: 02/09/21 10:45 PM  Result Value Ref Range   Alcohol, Ethyl (B) 117 (H) <10 mg/dL    Comment: (NOTE) Lowest detectable limit for serum alcohol is 10 mg/dL.  For medical purposes only. Performed at [facility], [address] [place], [place] [address]   Salicylate level     Status: Abnormal   Collection Time: 02/09/21 10:45 PM  Result Value Ref Range   Salicylate Lvl <7.0 (L) 7.0 - 30.0 mg/dL    Comment: Performed at [facility], [address] [place], [place] [address]  Acetaminophen level     Status: Abnormal   Collection Time: 02/09/21 10:45 PM  Result Value Ref Range   Acetaminophen (Tylenol), Serum <10 (L) 10 - 30 ug/mL    Comment: (NOTE) Therapeutic concentrations vary significantly. A range of 10-30 ug/mL  may be an effective concentration for many patients. However, some  are best treated at concentrations outside of this range. Acetaminophen concentrations >150 ug/mL at 4 hours after ingestion  and >50 ug/mL at 12 hours after ingestion are often associated with  toxic reactions.  Performed at [facility], [address] [place], [place] [address]     Current Facility-Administered Medications  Medication Dose Route Frequency Provider Last Rate Last Admin   LORazepam (ATIVAN) injection 0-4 mg  0-4 mg Intravenous Q6H Sharyn Creamer, MD       Or    LORazepam (ATIVAN) tablet 0-4 mg  0-4 mg Oral Q6H Sharyn Creamer, MD   2  mg at 02/09/21 2246   [START ON 02/12/2021] LORazepam (ATIVAN) injection 0-4 mg  0-4 mg Intravenous Q12H Sharyn Creamer, MD       Or   Melene Muller ON 02/12/2021] LORazepam (ATIVAN) tablet 0-4 mg  0-4 mg Oral Q12H Sharyn Creamer, MD       thiamine tablet 100 mg  100 mg Oral Daily Sharyn Creamer, MD       Or   thiamine (B-1) injection 100 mg  100 mg Intravenous Daily Sharyn Creamer, MD       Current Outpatient Medications  Medication Sig Dispense Refill   amoxicillin-clavulanate (AUGMENTIN) 875-125 MG tablet Take 1 tablet by mouth 2 (two) times daily. 20 tablet 0   buPROPion (WELLBUTRIN XL) 150 MG 24 hr tablet Take 150 mg by mouth daily.     busPIRone (BUSPAR) 7.5 MG tablet Take 7.5 mg by mouth in the morning and at bedtime.     butalbital-acetaminophen-caffeine (FIORICET) 50-325-40 MG tablet Take 1 tablet by mouth every 6 (six) hours as needed for headache. 14 tablet 0   cetirizine (ZYRTEC) 10 MG tablet Take 10 mg by mouth at bedtime.     FLUoxetine (PROZAC) 40 MG capsule Take 80 mg by mouth daily.     gabapentin (NEURONTIN) 300 MG capsule Take 1 capsule (300 mg total) by mouth at bedtime. (Patient not taking: Reported on 10/20/2020) 10 capsule 2   HYDROcodone-acetaminophen (NORCO/VICODIN) 5-325 MG tablet Take 1-2 tablets by mouth every 6 (six) hours as needed for moderate pain. (Patient not taking: Reported on 10/20/2020) 20 tablet 0   hydrOXYzine (ATARAX/VISTARIL) 50 MG tablet Take 50 mg by mouth at bedtime.     montelukast (SINGULAIR) 10 MG tablet Take 10 mg by mouth at bedtime.     neomycin-polymyxin-hydrocortisone (CORTISPORIN) OTIC solution Place 3 drops into the right ear 4 (four) times daily. X 5-7 days 10 mL 0   ondansetron (ZOFRAN ODT) 4 MG disintegrating tablet Take 1 tablet (4 mg total) by mouth every 8 (eight) hours as needed for nausea or vomiting. 20 tablet 0   ondansetron (ZOFRAN) 4 MG tablet Take 1 tablet (4 mg total) by mouth  every 8 (eight) hours as needed for nausea or vomiting. 20 tablet 0   traZODone (DESYREL) 100 MG tablet Take 100 mg by mouth at bedtime.      Musculoskeletal: Strength & Muscle Tone: within normal limits Gait & Station: normal Patient leans: N/A            Psychiatric Specialty Exam:  Presentation  General Appearance: Appropriate for Environment  Eye Contact:Fair  Speech:Slow  Speech Volume:Decreased  Handedness:Right   Mood and Affect  Mood:Depressed  Affect:Depressed; Flat; Blunt; Congruent   Thought Process  Thought Processes:Coherent  Descriptions of Associations:Intact  Orientation:Full (Time, Place and Person)  Thought Content:Logical  History of Schizophrenia/Schizoaffective disorder:No  Duration of Psychotic Symptoms:No data recorded Hallucinations:Hallucinations: None  Ideas of Reference:None  Suicidal Thoughts:Suicidal Thoughts: No  Homicidal Thoughts:Homicidal Thoughts: No   Sensorium  Memory:Immediate Fair; Recent Fair; Remote Fair  Judgment:Fair  Insight:Lacking   Executive Functions  Concentration:Fair  Attention Span:Fair  Recall:Fair  Fund of Knowledge:Fair  Language:Fair   Psychomotor Activity  Psychomotor Activity:Psychomotor Activity: Normal   Assets  Assets:Communication Skills; Desire for Improvement; Resilience; Social Support   Sleep  Sleep:Sleep: Good   Physical Exam: Physical Exam Vitals and nursing note reviewed.  Constitutional:      Comments: Sleepy   HENT:     Head: Normocephalic.     Nose:  No congestion or rhinorrhea.  Eyes:     General:        Right eye: No discharge.        Left eye: No discharge.  Cardiovascular:     Rate and Rhythm: Normal rate.  Pulmonary:     Effort: Pulmonary effort is normal.  Musculoskeletal:        General: Normal range of motion.     Cervical back: Normal range of motion.  Skin:    General: Skin is dry.  Neurological:     Mental Status: She is  alert and oriented to person, place, and time.  Psychiatric:        Attention and Perception: Attention normal.        Mood and Affect: Mood is depressed. Affect is flat.        Speech: Speech normal.        Behavior: Behavior is cooperative.        Cognition and Memory: Cognition normal.        Judgment: Judgment is impulsive.   Review of Systems  Psychiatric/Behavioral:  Positive for depression and suicidal ideas.   Blood pressure 110/74, pulse 72, temperature 98.6 F (37 C), temperature source Oral, resp. rate 17, height  (1.676 m), weight 81.6 kg, last menstrual period 07/30/2020, SpO2 99 %. Body mass index is 29.05 kg/m.  Treatment Plan Summary: Daily contact with patient to assess and evaluate symptoms and progress in treatment and Medication management. Recommend inpatient psychiatric hospitalization. Discussed with EDP.  Disposition: Recommend psychiatric Inpatient admission when medically cleared.  Vanetta Mulders, NP 02/10/2021 9:42 AM

## 2021-02-10 NOTE — ED Notes (Signed)
Breakfast tray given. Pt continues to be asleep.

## 2021-02-10 NOTE — ED Notes (Signed)
Pt discharged to BMU under IVC.  Belongings sent with patient. Pt cooperative.

## 2021-02-10 NOTE — Progress Notes (Signed)
Patient not exhibiting signs of withdrawal. Scored based on headache but has history of migraine and reports history of anxiety. Will continue to monitor.

## 2021-02-10 NOTE — Tx Team (Signed)
Initial Treatment Plan 02/10/2021 3:41 PM Gisel [address] Maisie Fus IEP:[id number]      PATIENT STRESSORS: Loss of sibling    Marital or family conflict   Occupational concerns     PATIENT STRENGTHS: Average or above average intelligence  Personnel officer means  Physical Health  Supportive family/friends  Work skills    PATIENT IDENTIFIED PROBLEMS:    depression    anxiety    Substance abuse             DISCHARGE CRITERIA:  Adequate post-discharge living arrangements Improved stabilization in mood, thinking, and/or behavior Verbal commitment to aftercare and medication compliance  PRELIMINARY DISCHARGE PLAN: Outpatient therapy Return to previous work or school arrangements  PATIENT/FAMILY INVOLVEMENT: This treatment plan has been presented to and reviewed with the patient, Mija E [employer].  The patient has been given the opportunity to ask questions and make suggestions.  Chalmers Cater, RN 02/10/2021, 3:41 PM

## 2021-02-10 NOTE — Progress Notes (Signed)
Patient admitted to BMU with Depression and Substance abuse. Patient has a flat affect but is calm and cooperative during assessment. Speaks in a low tone. Denies SI/HI/AH/VH. Reports being depressed and anxious. Takes prescriptions medications for diagnosis. Reports worsening drinking over past 2 months. States, "I have about 5-6 Truly's a night. Currently on CIWA. Per ER report patient is not scoring. Patient reports history of caffeine use daily and migraine headaches. Report to RN that she has a prescription. Reports pain at admission. See pain assessment for details. NO s/s of distress. Will cont to monitor for safety.

## 2021-02-10 NOTE — Group Note (Signed)
[facility] LCSW Group Therapy Note   Group Date: 02/10/2021 Start Time: 1300 End Time: 1400   Type of Therapy/Topic:  Group Therapy:  Emotion Regulation  Participation Level:  Did Not Attend   Mood:  Description of Group:    The purpose of this group is to assist patients in learning to regulate negative emotions and experience positive emotions. Patients will be guided to discuss ways in which they have been vulnerable to their negative emotions. These vulnerabilities will be juxtaposed with experiences of positive emotions or situations, and patients challenged to use positive emotions to combat negative ones. Special emphasis will be placed on coping with negative emotions in conflict situations, and patients will process healthy conflict resolution skills.  Therapeutic Goals: Patient will identify two positive emotions or experiences to reflect on in order to balance out negative emotions:  Patient will label two or more emotions that they find the most difficult to experience:  Patient will be able to demonstrate positive conflict resolution skills through discussion or role plays:   Summary of Patient Progress: Patient did not attend group despite encouraged participation.     Therapeutic Modalities:   Cognitive Behavioral Therapy Feelings Identification Dialectical Behavioral Therapy   Corky Crafts, [place]

## 2021-02-11 DIAGNOSIS — F332 Major depressive disorder, recurrent severe without psychotic features: Principal | ICD-10-CM

## 2021-02-11 MED ORDER — BUSPIRONE HCL 5 MG PO TABS
7.5000 mg | ORAL_TABLET | Freq: Two times a day (BID) | ORAL | Status: DC
  Administered 2021-02-11 – 2021-02-13 (×4): 7.5 mg via ORAL
  Filled 2021-02-11 (×4): qty 2

## 2021-02-11 MED ORDER — BUPROPION HCL ER (XL) 150 MG PO TB24
150.0000 mg | ORAL_TABLET | Freq: Every day | ORAL | Status: DC
  Administered 2021-02-11 – 2021-02-13 (×3): 150 mg via ORAL
  Filled 2021-02-11 (×3): qty 1

## 2021-02-11 MED ORDER — FLUOXETINE HCL 20 MG PO CAPS
80.0000 mg | ORAL_CAPSULE | Freq: Every day | ORAL | Status: DC
  Administered 2021-02-11 – 2021-02-13 (×3): 80 mg via ORAL
  Filled 2021-02-11 (×3): qty 4

## 2021-02-11 MED ORDER — TRAZODONE HCL 100 MG PO TABS
100.0000 mg | ORAL_TABLET | Freq: Every evening | ORAL | Status: DC | PRN
  Administered 2021-02-11 – 2021-02-12 (×2): 100 mg via ORAL
  Filled 2021-02-11 (×2): qty 1

## 2021-02-11 NOTE — Plan of Care (Signed)
See progress note Problem: Education: Goal: Utilization of techniques to improve thought processes will improve Outcome: Progressing Goal: Knowledge of the prescribed therapeutic regimen will improve Outcome: Progressing   Problem: Activity: Goal: Interest or engagement in leisure activities will improve Outcome: Progressing Goal: Imbalance in normal sleep/wake cycle will improve Outcome: Progressing   Problem: Coping: Goal: Coping ability will improve Outcome: Progressing Goal: Will verbalize feelings Outcome: Progressing   Problem: Health Behavior/Discharge Planning: Goal: Ability to make decisions will improve Outcome: Progressing Goal: Compliance with therapeutic regimen will improve Outcome: Progressing   Problem: Role Relationship: Goal: Will demonstrate positive changes in social behaviors and relationships Outcome: Progressing   Problem: Safety: Goal: Ability to disclose and discuss suicidal ideas will improve Outcome: Progressing Goal: Ability to identify and utilize support systems that promote safety will improve Outcome: Progressing   Problem: Self-Concept: Goal: Will verbalize positive feelings about self Outcome: Progressing Goal: Level of anxiety will decrease Outcome: Progressing   Problem: Education: Goal: Ability to state activities that reduce stress will improve Outcome: Progressing   Problem: Coping: Goal: Ability to identify and develop effective coping behavior will improve Outcome: Progressing   Problem: Self-Concept: Goal: Ability to identify factors that promote anxiety will improve Outcome: Progressing Goal: Level of anxiety will decrease Outcome: Progressing Goal: Ability to modify response to factors that promote anxiety will improve Outcome: Progressing   Problem: Education: Goal: Knowledge of [place] [facility] Education information/materials will improve Outcome: Progressing Goal: Emotional status will improve Outcome:  Progressing Goal: Mental status will improve Outcome: Progressing Goal: Verbalization of understanding the information provided will improve Outcome: Progressing   Problem: Activity: Goal: Interest or engagement in activities will improve Outcome: Progressing Goal: Sleeping patterns will improve Outcome: Progressing   Problem: Coping: Goal: Ability to verbalize frustrations and anger appropriately will improve Outcome: Progressing Goal: Ability to demonstrate self-control will improve Outcome: Progressing   Problem: Health Behavior/Discharge Planning: Goal: Identification of resources available to assist in meeting health care needs will improve Outcome: Progressing Goal: Compliance with treatment plan for underlying cause of condition will improve Outcome: Progressing   Problem: Physical Regulation: Goal: Ability to maintain clinical measurements within normal limits will improve Outcome: Progressing   Problem: Safety: Goal: Periods of time without injury will increase Outcome: Progressing

## 2021-02-11 NOTE — Progress Notes (Signed)
Pt ate breakfast in the dining room. Very quiet on approach and no interaction with peers or staff. Pt denies SI/HI and auditory or visual hallucinations. She was not open with providing information about what cause this hospital. She denies having alcohol withdrawal symptoms. She denies feelings of anxiety, and vital signs stable . Affect sad and she has low energy.

## 2021-02-11 NOTE — Progress Notes (Signed)
Recreation Therapy Notes  INPATIENT RECREATION TR PLAN  Patient Details Name: Karen Park MRN: [id number] DOB: 1980-07-20 Today's Date: 02/11/2021  Rec Therapy Plan Is patient appropriate for Therapeutic Recreation?: Yes Treatment times per week: at least 3 Estimated Length of Stay: 5-7 days TR Treatment/Interventions: Group participation (Comment)  Discharge Criteria Pt will be discharged from therapy if:: Discharged Treatment plan/goals/alternatives discussed and agreed upon by:: Patient/family  Discharge Summary     Bernard Donahoo 02/11/2021, 4:07 PM

## 2021-02-11 NOTE — Progress Notes (Signed)
Pt in stable mood, interacting positively with peers. Pt has minimal interactions with staff. She does not like to vocalize concerns, sometimes is avoidant during conversation. Pt very cautious while in unit. Denies SI/HI. Denies AVH. Pt states she still feels depressed and sad. Pt denies in withdrawal symptoms. Pt did complain of having insomnia and wanted to resume her home medication of Trazodone. Pt states without that she cannot sleep.   Pt given PRN medication for insomnia.   Pt in room sleeping, resting comfortably after being given medication for insomnia.    02/11/21 2100  Psych Admission Type (Psych Patients Only)  Admission Status Involuntary  Psychosocial Assessment  Patient Complaints Sadness  Eye Contact Fair  Facial Expression Flat  Affect Sad  Speech Unremarkable  Interaction Minimal  Motor Activity Other (Comment) (WNL)  Appearance/Hygiene Unremarkable  Behavior Characteristics Appropriate to situation  Mood Sad  Thought Process  Coherency WDL  Content WDL  Delusions None reported or observed  Perception WDL  Hallucination None reported or observed  Judgment WDL  Confusion None  Danger to Self  Current suicidal ideation? Denies  Danger to Others  Danger to Others None reported or observed

## 2021-02-11 NOTE — [facility] Suicide Risk Assessment (Signed)
[facility] INPATIENT:  Family/Significant Other Suicide Prevention Education  Suicide Prevention Education:  Education Completed; Karen Park, husband, [phone] has been identified by the patient as the family member/significant other with whom the patient will be residing, and identified as the person(s) who will aid the patient in the event of a mental health crisis (suicidal ideations/suicide attempt).  With written consent from the patient, the family member/significant other has been provided the following suicide prevention education, prior to the and/or following the discharge of the patient.  The suicide prevention education provided includes the following: Suicide risk factors Suicide prevention and interventions National Suicide Hotline telephone number [facility] assessment telephone number [facility] Emergency Assistance 911 [facility] and/or Residential Mobile Crisis Unit telephone number  Request made of family/significant other to: Remove weapons (e.g., guns, rifles, knives), all items previously/currently identified as safety concern.   Remove drugs/medications (over-the-counter, prescriptions, illicit drugs), all items previously/currently identified as a safety concern.  The family member/significant other verbalizes understanding of the suicide prevention education information provided.  The family member/significant other agrees to remove the items of safety concern listed above.  Husband reports that "I got a call from her therapist that she was worried because her thoughts were wondering".  He reports that the patient later called him and later told him that she didn't know where she was and her battery was low.  He reports that when he located the patient the patient did not know who he was and asked "how do I know you are not a serial killer" and he had to "coax" her into the car.  He reports that the patient locked herself in a closet in the home  and did not go to work.  He reports that she made statements of wanting to cut herself and has a history of suicide attempts.  He reports that this all happened within 24 hours.  He reports that the patient started make self-deprecating statements. He reports that patient is not a danger to others.  He reports that patient does not have access to weapons.   He reports that he has spoke with patient and has noticed an improvement in the patient's disposition.   He reports that his goal is get the patient "in to long-term therapy".     Harden Mo 02/11/2021, 2:16 PM

## 2021-02-11 NOTE — Progress Notes (Signed)
Assumed care for pt at about 19:30 She was resting in the day area, watching TV with peers, in no apparent distress. No behavioral problems, ciwa-ar scored approx 6 based on reported headache and anxiety,otherwise no signs of active withdrawal from alcohol.Pt received 1 mg PO Lorazepam per ciwa-ar scoring tool. She denied any avh/hi/si. She is resting in bed, being monitored as ordered.

## 2021-02-11 NOTE — Progress Notes (Signed)
Recreation Therapy Notes   Date: 02/11/2021  Time: 10:30 am   Location: Craft room    Behavioral response: Appropriate  Intervention Topic: Relaxation    Discussion/Intervention:  Group content today was focused on relaxation. The group defined relaxation and identified healthy ways to relax. Individuals expressed how much time they spend relaxing. Patients expressed how much their life would be if they did not make time for themselves to relax. The group stated ways they could improve their relaxation techniques in the future.  Individuals participated in the intervention "Time to Relax" where they had a chance to experience different relaxation techniques.   Clinical Observations/Feedback: Patient came to group late due to unknown reasons. Individual was social with peers and staff while participating in the intervention.  [name] LRT/CTRS             [name] 02/11/2021 12:29 PM

## 2021-02-11 NOTE — Group Note (Addendum)
[facility] LCSW Group Therapy Note   Group Date: 02/11/2021 Start Time: 1400 End Time: 1500  Type of Therapy and Topic:  Group Therapy:  Feelings around Relapse and Recovery  Participation Level:  Active   Mood: Bunted affect, appeared tired.   Description of Group:    Patients in this group will discuss emotions they experience before and after a relapse. They will process how experiencing these feelings, or avoidance of experiencing them, relates to having a relapse. Facilitator will guide patients to explore emotions they have related to recovery. Patients will be encouraged to process which emotions are more powerful. They will be guided to discuss the emotional reaction significant others in their lives may have to patients' relapse or recovery. Patients will be assisted in exploring ways to respond to the emotions of others without this contributing to a relapse.  Therapeutic Goals: Patient will identify two or more emotions that lead to relapse for them:  Patient will identify two emotions that result when they relapse:  Patient will identify two emotions related to recovery:  Patient will demonstrate ability to communicate their needs through discussion and/or role plays.   Summary of Patient Progress: Patient was present for the entirety of the group session. Patient was an active listener and participated in the topic of discussion, provided helpful advice to others, and added nuance to topic of conversation. Patient shared with group different behaviors that she has difficulty moderating. Provided inciteful conversation to the group.    Therapeutic Modalities:   Cognitive Behavioral Therapy Solution-Focused Therapy Assertiveness Training Relapse Prevention Therapy   Corky Crafts, [place]

## 2021-02-11 NOTE — Progress Notes (Signed)
   02/11/21 1240  Clinical Encounter Type  Visited With Patient  Visit Type Initial  Referral From Nurse  Consult/Referral To Chaplain   Chaplain contacted nurse regarding OR that was in the system. Staff stated it was no longer needed, and would contact on-call chaplain if further assistance.   Posey Boyer, M. Div.

## 2021-02-11 NOTE — Progress Notes (Signed)
Pt observed having involuntary mouth movements, she states she has mouth tics.

## 2021-02-11 NOTE — Progress Notes (Signed)
Recreation Therapy Notes  INPATIENT RECREATION THERAPY ASSESSMENT  Patient Details Name: Karen Park MRN: [id number] DOB: [birth year] Today's Date: 02/11/2021       Information Obtained From: Patient  Able to Participate in Assessment/Interview: Yes  Patient Presentation: Responsive  Reason for Admission (Per Patient): Active Symptoms  Patient Stressors:    Coping Skills:   Art, Read, Other (Comment) (Sleep)  Leisure Interests (2+):  Music - Listen, Individual - Reading  Frequency of Recreation/Participation: Monthly  Awareness of Community Resources:  Yes  Community Resources:  [employer]  Current Use: Yes  If no, Barriers?:    Expressed Interest in [employer] Information: No  [place] Residence[place]  Patient Main Form of Transportation: Set designer  Patient Strengths:  Good listener  Patient Identified Areas of Improvement:  My anger  Patient Goal for Hospitalization:  Figure out my medication and talk about some stuff and go home  Current SI (including self-harm):  No  Current HI:  No  Current AVH: No  Staff Intervention Plan: Group Attendance, Collaborate with Interdisciplinary Treatment Team  Consent to Intern Participation: N/A  [name] 02/11/2021, 4:07 PM
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
## 2021-02-11 NOTE — Progress Notes (Signed)
Pt has antidepressants medication ordered and first doses given. This afternoon pt was in the TV, and quietly did some coloring sheets. She continues to deny SI/HI and AVH.

## 2021-02-11 NOTE — Progress Notes (Signed)
Pt isolates often and frequently instructed to get out of bed to attend groups. She denies SI/HI and AVH. She has low energy and affect sad-flat.

## 2021-02-11 NOTE — H&P (Signed)
Psychiatric Admission Assessment Adult  Patient Identification: Karen Park MRN:  [id number] Date of Evaluation:  02/11/2021 Chief Complaint:  major depression Principal Diagnosis: Severe recurrent major depression without psychotic features (HCC) Diagnosis:  Principal Problem:   Severe recurrent major depression without psychotic features (HCC)  History of Present Illness: Patient seen and chart reviewed.  This is a 40 year old woman with a past history of depression who was brought to the emergency room under IVC.  Husband reported that she had been decompensating recently and drinking more.  2 nights in a row apparently she wandered away from home at least on 1 occasion saying that she was going to walk to the airport so that she could fly to [facility] to visit her brother.  Her brother has been dead for several years.  On interview today the patient says that she never intended to walk to the airport and that she is completely aware that her brother is deceased.  He killed himself several years ago when she does have strong emotional investment and it but had only wanted to visit his grave.  She admits that she has been more depressed and anxious recently.  Overwhelmed by a variety of stresses.  Patient is married and has a 47 year old son and several pets at home.  She recently started working at [employer].  Husband apparently works a very busy schedule and is not around very much.  Patient denies other drug use and also says she does not think her drinking was that big of a problem.  She did have a blood alcohol level of 117 when she presented here.  She is currently denying suicidal ideation denying hallucinations and does not appear to be psychotic.  She says she is still taking medication fluoxetine 80 mg/day Wellbutrin XL 150 mg a day and buspirone 7.5 mg twice a day although she had not yet started the BuSpar.  She talks to a therapist in [place] remotely occasionally but  that is her only active psychiatric treatment. Associated Signs/Symptoms: Depression Symptoms:  depressed mood, insomnia, fatigue, difficulty concentrating, recurrent thoughts of death, anxiety, Duration of Depression Symptoms: Greater than two weeks  (Hypo) Manic Symptoms:  Distractibility, Impulsivity, Anxiety Symptoms:  Excessive Worry, Psychotic Symptoms:   Patient denies any.  She denies that she ever entertained any idea that her brother was still living or that she could walk to an airport.  Does not present currently as being psychotic PTSD Symptoms: Patient states she needs to see a trauma counselor.  She indicates that she has a history of trauma.  Not clear if that is just from the brother's death she did not want to go into any other details about it. Total Time spent with patient: 1 hour  Past Psychiatric History: Patient reports at least 1 prior hospitalization a few years ago.  At that time she was feeling overwhelmed and said that she was having "homicidal ideation" and was afraid to be around her child.  She was hospitalized in [place].  This was around the time apparently that she was getting bariatric surgery.  She denies however ever being violent or aggressive to her son or anyone else.  She does admit that she took an overdose of some pills a couple months ago but told no one about it at that time.  Denies other suicide attempts.  Is the patient at risk to self? Yes.    Has the patient been a risk to self in the past 6  months? Yes.    Has the patient been a risk to self within the distant past? Yes.    Is the patient a risk to others? No.  Has the patient been a risk to others in the past 6 months? No.  Has the patient been a risk to others within the distant past? Yes.     Prior Inpatient Therapy:   Prior Outpatient Therapy:    Alcohol Screening: Patient refused Alcohol Screening Tool: Yes 1. How often do you have a drink containing alcohol?: 4 or more  times a week 2. How many drinks containing alcohol do you have on a typical day when you are drinking?: 5 or 6 3. How often do you have six or more drinks on one occasion?: Monthly AUDIT-C Score: 8 4. How often during the last year have you found that you were not able to stop drinking once you had started?: Never 5. How often during the last year have you failed to do what was normally expected from you because of drinking?: Never 6. How often during the last year have you needed a first drink in the morning to get yourself going after a heavy drinking session?: Never 7. How often during the last year have you had a feeling of guilt of remorse after drinking?: Never 8. How often during the last year have you been unable to remember what happened the night before because you had been drinking?: Never 9. Have you or someone else been injured as a result of your drinking?: No 10. Has a relative or friend or a doctor or another health worker been concerned about your drinking or suggested you cut down?: No Alcohol Use Disorder Identification Test Final Score (AUDIT): 8 Alcohol Brief Interventions/Follow-up: Alcohol education/Brief advice Substance Abuse History in the last 12 months:  Yes.   Consequences of Substance Abuse: Alcohol intoxication seems to have played a key role in the current presentation although the patient is denying that it is an ongoing issue.  No other drugs that she admits to had no drug screen was performed Previous Psychotropic Medications: Yes  Psychological Evaluations: Yes  Past Medical History:  Past Medical History:  Diagnosis Date   Anxiety    Arthritis    both knees, fingers, spine   Depression    History of hiatal hernia    repair 2020   History of kidney stones    Migraine    Pneumonia     Past Surgical History:  Procedure Laterality Date   ABDOMINAL HYSTERECTOMY     BARIATRIC SURGERY  01/02/2018   CESAREAN SECTION     CHOLECYSTECTOMY     COLONOSCOPY      dental work     ESOPHAGOGASTRODUODENOSCOPY     exploratory laparoscopic     GASTRIC BYPASS     HERNIA REPAIR     x2   HYSTERECTOMY ABDOMINAL WITH SALPINGECTOMY Bilateral 08/16/2020   Procedure: HYSTERECTOMY ABDOMINAL WITH BILATERAL SALPINGECTOMY  (L) OOPHORECTOMY;  Surgeon: Suzy Bouchard, MD;  Location: [facility] ORS;  Service: Gynecology;  Laterality: Bilateral;   OOPHORECTOMY Left 08/16/2020   Procedure: OOPHORECTOMY;  Surgeon: Schermerhorn, Ihor Austin, MD;  Location: [facility] ORS;  Service: Gynecology;  Laterality: Left;   Family History: History reviewed. No pertinent family history. Family Psychiatric  History: Patient reports that she had a brother who killed himself. Tobacco Screening:   Social History:  Social History   Substance and Sexual Activity  Alcohol Use Not Currently   Alcohol/week: 2.0 standard  drinks   Types: 1 Glasses of wine, 1 Cans of beer per week   Comment: rarely     Social History   Substance and Sexual Activity  Drug Use Not Currently    Additional Social History: Marital status: Married Number of Years Married: 13 What types of issues is patient dealing with in the relationship?: "we've never had a fight" How many children?: 1 How is patient's relationship with their children?: "he's the best"                         Allergies:   Allergies  Allergen Reactions   Other Swelling    Blue cheese.   Lab Results:  Results for orders placed or performed during the hospital encounter of 02/09/21 (from the past 48 hour(s))  POC urine preg, ED (not at [facility])     Status: None   Collection Time: 02/09/21  9:53 PM  Result Value Ref Range   Preg Test, Ur NEGATIVE NEGATIVE    Comment:        THE SENSITIVITY OF THIS METHODOLOGY IS >24 mIU/mL   Resp Panel by RT-PCR (Flu A&B, Covid) Nasopharyngeal Swab     Status: None   Collection Time: 02/09/21 10:45 PM   Specimen: Nasopharyngeal Swab; Nasopharyngeal(NP) swabs in vial transport medium   Result Value Ref Range   SARS Coronavirus 2 by RT PCR NEGATIVE NEGATIVE    Comment: (NOTE) SARS-CoV-2 target nucleic acids are NOT DETECTED.  The SARS-CoV-2 RNA is generally detectable in upper respiratory specimens during the acute phase of infection. The lowest concentration of SARS-CoV-2 viral copies this assay can detect is 138 copies/mL. A negative result does not preclude SARS-Cov-2 infection and should not be used as the sole basis for treatment or other patient management decisions. A negative result may occur with  improper specimen collection/handling, submission of specimen other than nasopharyngeal swab, presence of viral mutation(s) within the areas targeted by this assay, and inadequate number of viral copies(<138 copies/mL). A negative result must be combined with clinical observations, patient history, and epidemiological information. The expected result is Negative.  Fact Sheet for Patients:  BloggerCourse.com  Fact Sheet for Healthcare Providers:  SeriousBroker.it  This test is no t yet approved or cleared by the [place] FDA and  has been authorized for detection and/or diagnosis of SARS-CoV-2 by FDA under an Emergency Use Authorization (EUA). This EUA will remain  in effect (meaning this test can be used) for the duration of the COVID-19 declaration under Section 564(b)(1) of the Act, 21 U.S.C.section 360bbb-3(b)(1), unless the authorization is terminated  or revoked sooner.       Influenza A by PCR NEGATIVE NEGATIVE   Influenza B by PCR NEGATIVE NEGATIVE    Comment: (NOTE) The Xpert Xpress SARS-CoV-2/FLU/RSV plus assay is intended as an aid in the diagnosis of influenza from Nasopharyngeal swab specimens and should not be used as a sole basis for treatment. Nasal washings and aspirates are unacceptable for Xpert Xpress SARS-CoV-2/FLU/RSV testing.  Fact Sheet for  Patients: BloggerCourse.com  Fact Sheet for Healthcare Providers: SeriousBroker.it  This test is not yet approved or cleared by the [place] FDA and has been authorized for detection and/or diagnosis of SARS-CoV-2 by FDA under an Emergency Use Authorization (EUA). This EUA will remain in effect (meaning this test can be used) for the duration of the COVID-19 declaration under Section 564(b)(1) of the Act, 21 U.S.C. section 360bbb-3(b)(1), unless the authorization is terminated  or revoked.  Performed at [facility], [address] [place], [place] [address]   CBC     Status: None   Collection Time: 02/09/21 10:45 PM  Result Value Ref Range   WBC 6.3 4.0 - 10.5 K/uL   RBC 4.01 3.87 - 5.11 MIL/uL   Hemoglobin 12.3 12.0 - 15.0 g/dL   HCT 45.8 09.9 - 83.3 %   MCV 93.8 80.0 - 100.0 fL   MCH 30.7 26.0 - 34.0 pg   MCHC 32.7 30.0 - 36.0 g/dL   RDW 82.5 05.3 - 97.6 %   Platelets 373 150 - 400 K/uL   nRBC 0.0 0.0 - 0.2 %    Comment: Performed at [facility], [address], [place], [place] [address]  Basic metabolic panel     Status: Abnormal   Collection Time: 02/09/21 10:45 PM  Result Value Ref Range   Sodium 134 (L) 135 - 145 mmol/L   Potassium 3.4 (L) 3.5 - 5.1 mmol/L   Chloride 101 98 - 111 mmol/L   CO2 25 22 - 32 mmol/L   Glucose, Bld 94 70 - 99 mg/dL    Comment: Glucose reference range applies only to samples taken after fasting for at least 8 hours.   BUN 7 6 - 20 mg/dL   Creatinine, Ser 3.79 0.44 - 1.00 mg/dL   Calcium 9.0 8.9 - 02.4 mg/dL   GFR, Estimated >09 >73 mL/min    Comment: (NOTE) Calculated using the CKD-EPI Creatinine Equation (2021)    Anion gap 8 5 - 15    Comment: Performed at [facility], [address] [place], [place] [address]  Ethanol     Status: Abnormal   Collection Time: 02/09/21 10:45 PM  Result Value Ref Range   Alcohol, Ethyl (B) 117 (H) <10 mg/dL     Comment: (NOTE) Lowest detectable limit for serum alcohol is 10 mg/dL.  For medical purposes only. Performed at [facility], [address] [place], [place] [address]   Salicylate level     Status: Abnormal   Collection Time: 02/09/21 10:45 PM  Result Value Ref Range   Salicylate Lvl <7.0 (L) 7.0 - 30.0 mg/dL    Comment: Performed at [facility] [address] [place], [place] [address]  Acetaminophen level     Status: Abnormal   Collection Time: 02/09/21 10:45 PM  Result Value Ref Range   Acetaminophen (Tylenol), Serum <10 (L) 10 - 30 ug/mL    Comment: (NOTE) Therapeutic concentrations vary significantly. A range of 10-30 ug/mL  may be an effective concentration for many patients. However, some  are best treated at concentrations outside of this range. Acetaminophen concentrations >150 ug/mL at 4 hours after ingestion  and >50 ug/mL at 12 hours after ingestion are often associated with  toxic reactions.  Performed at [facility], [address] [place], [place] [address]     Blood Alcohol level:  Lab Results  Component Value Date   ETH 117 (H) 02/09/2021    Metabolic Disorder Labs:  No results found for: HGBA1C, MPG No results found for: PROLACTIN No results found for: CHOL, TRIG, HDL, CHOLHDL, VLDL, LDLCALC  Current Medications: Current Facility-Administered Medications  Medication Dose Route Frequency Provider Last Rate Last Admin   acetaminophen (TYLENOL) tablet 650 mg  650 mg Oral Q6H PRN Vanetta Mulders, NP   650 mg at 02/10/21 1706   alum & mag hydroxide-simeth (MAALOX/MYLANTA) 200-200-20 MG/5ML suspension 30 mL  30 mL Oral Q4H PRN Gabriel Cirri F, NP       buPROPion (WELLBUTRIN XL) 24 hr tablet 150 mg  150 mg Oral Daily Bradely Rudin T, MD       busPIRone (BUSPAR) tablet 7.5 mg  7.5 mg Oral BID Simcha Farrington, Jackquline Denmark, MD       FLUoxetine (PROZAC) capsule 80 mg  80 mg Oral Daily Jem Castro T, MD       magnesium hydroxide  (MILK OF MAGNESIA) suspension 30 mL  30 mL Oral Daily PRN Gabriel Cirri F, NP       nicotine (NICODERM CQ - dosed in mg/24 hours) patch 14 mg  14 mg Transdermal Daily Raysa Bosak T, MD   14 mg at 02/11/21 0830   thiamine tablet 100 mg  100 mg Oral Daily Gabriel Cirri F, NP   100 mg at 02/11/21 9604   Or   thiamine (B-1) injection 100 mg  100 mg Intravenous Daily Gabriel Cirri F, NP       PTA Medications: Medications Prior to Admission  Medication Sig Dispense Refill Last Dose   amoxicillin-clavulanate (AUGMENTIN) 875-125 MG tablet Take 1 tablet by mouth 2 (two) times daily. 20 tablet 0    buPROPion (WELLBUTRIN XL) 150 MG 24 hr tablet Take 150 mg by mouth daily.      busPIRone (BUSPAR) 7.5 MG tablet Take 7.5 mg by mouth in the morning and at bedtime.      butalbital-acetaminophen-caffeine (FIORICET) 50-325-40 MG tablet Take 1 tablet by mouth every 6 (six) hours as needed for headache. 14 tablet 0    cetirizine (ZYRTEC) 10 MG tablet Take 10 mg by mouth at bedtime.      FLUoxetine (PROZAC) 40 MG capsule Take 80 mg by mouth daily.      gabapentin (NEURONTIN) 300 MG capsule Take 1 capsule (300 mg total) by mouth at bedtime. (Patient not taking: Reported on 10/20/2020) 10 capsule 2    HYDROcodone-acetaminophen (NORCO/VICODIN) 5-325 MG tablet Take 1-2 tablets by mouth every 6 (six) hours as needed for moderate pain. (Patient not taking: Reported on 10/20/2020) 20 tablet 0    hydrOXYzine (ATARAX/VISTARIL) 50 MG tablet Take 50 mg by mouth at bedtime.      montelukast (SINGULAIR) 10 MG tablet Take 10 mg by mouth at bedtime.      neomycin-polymyxin-hydrocortisone (CORTISPORIN) OTIC solution Place 3 drops into the right ear 4 (four) times daily. X 5-7 days 10 mL 0    ondansetron (ZOFRAN ODT) 4 MG disintegrating tablet Take 1 tablet (4 mg total) by mouth every 8 (eight) hours as needed for nausea or vomiting. 20 tablet 0    ondansetron (ZOFRAN) 4 MG tablet Take 1 tablet (4 mg total) by mouth every 8  (eight) hours as needed for nausea or vomiting. 20 tablet 0    traZODone (DESYREL) 100 MG tablet Take 100 mg by mouth at bedtime.       Musculoskeletal: Strength & Muscle Tone: within normal limits Gait & Station: normal Patient leans: N/A            Psychiatric Specialty Exam:  Presentation  General Appearance: Appropriate for Environment  Eye Contact:Fair  Speech:Slow  Speech Volume:Decreased  Handedness:Right   Mood and Affect  Mood:Depressed  Affect:Depressed; Flat; Blunt; Congruent   Thought Process  Thought Processes:Coherent  Duration of Psychotic Symptoms: No data recorded Past Diagnosis of Schizophrenia or Psychoactive disorder: No  Descriptions of Associations:Intact  Orientation:Full (Time, Place and Person)  Thought Content:Logical  Hallucinations:Hallucinations: None  Ideas of Reference:None  Suicidal Thoughts:Suicidal Thoughts: No  Homicidal Thoughts:Homicidal Thoughts: No   Sensorium  Memory:Immediate Fair; Recent Fair; Remote Fair  Judgment:Fair  Insight:Lacking   Executive Functions  Concentration:Fair  Attention Span:Fair  Recall:Fair  Fund of Knowledge:Fair  Language:Fair   Psychomotor Activity  Psychomotor Activity:Psychomotor Activity: Normal   Assets  Assets:Communication Skills; Desire for Improvement; Resilience; Social Support   Sleep  Sleep:Sleep: Good    Physical Exam: Physical Exam Vitals and nursing note reviewed.  Constitutional:      Appearance: Normal appearance.  HENT:     Head: Normocephalic and atraumatic.     Mouth/Throat:     Pharynx: Oropharynx is clear.  Eyes:     Pupils: Pupils are equal, round, and reactive to light.  Cardiovascular:     Rate and Rhythm: Normal rate and regular rhythm.  Pulmonary:     Effort: Pulmonary effort is normal.     Breath sounds: Normal breath sounds.  Abdominal:     General: Abdomen is flat.     Palpations: Abdomen is soft.   Musculoskeletal:        General: Normal range of motion.  Skin:    General: Skin is warm and dry.  Neurological:     General: No focal deficit present.     Mental Status: She is alert. Mental status is at baseline.  Psychiatric:        Attention and Perception: She is inattentive.        Mood and Affect: Mood is depressed. Affect is blunt.        Speech: Speech is delayed.        Behavior: Behavior is slowed.        Thought Content: Thought content normal.        Cognition and Memory: Cognition is impaired. Memory is impaired.   Review of Systems  Constitutional: Negative.   HENT: Negative.    Eyes: Negative.   Respiratory: Negative.    Cardiovascular: Negative.   Gastrointestinal: Negative.   Musculoskeletal: Negative.   Skin: Negative.   Neurological: Negative.   Psychiatric/Behavioral:  Positive for depression and substance abuse. Negative for hallucinations and suicidal ideas. The patient is nervous/anxious and has insomnia.   Blood pressure 114/75, pulse 91, temperature 98.6 F (37 C), temperature source Oral, resp. rate 20, height 5\' 6"  (1.676 m), weight 84.1 kg, last menstrual period 07/30/2020, SpO2 100 %. Body mass index is 29.94 kg/m.  Treatment Plan Summary: Daily contact with patient to assess and evaluate symptoms and progress in treatment, Medication management, and Plan restart fluoxetine 80 mg a day and Wellbutrin 150 mg a day.  Discussed strategies for managing depression and antidepressant medicine.  Agreed that it was worthwhile to start the buspirone that her physician had recommended so I will start that at 7.5 mg twice a day.  Engage patient in individual and group counseling.  Estimated length of stay probably no more than 2 to 3 days.  Patient admits that she very much needs to get into active therapy and mental health treatment locally at discharge.  Observation Level/Precautions:  15 minute checks  Laboratory:  UDS  Psychotherapy:    Medications:     Consultations:    Discharge Concerns:    Estimated LOS:  Other:     Physician Treatment Plan for Primary Diagnosis: Severe recurrent major depression without psychotic features (HCC) Long Term Goal(s): Improvement in symptoms so as ready for discharge  Short Term Goals: Ability to  disclose and discuss suicidal ideas, Ability to demonstrate self-control will improve, and Ability to maintain clinical measurements within normal limits will improve  Physician Treatment Plan for Secondary Diagnosis: Principal Problem:   Severe recurrent major depression without psychotic features (HCC)  Long Term Goal(s): Improvement in symptoms so as ready for discharge  Short Term Goals: Ability to maintain clinical measurements within normal limits will improve  I certify that inpatient services furnished can reasonably be expected to improve the patient's condition.    Mordecai Rasmussen, MD 12/2/20224:31 PM
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
## 2021-02-11 NOTE — BH IP Treatment Plan (Signed)
Interdisciplinary Treatment and Diagnostic Plan Update  02/11/2021 Time of Session: 0900 Tanessa Tidd MRN: [id number]  Principal Diagnosis: <principal problem not specified>  Secondary Diagnoses: Active Problems:   * No active hospital problems. *   Current Medications:  Current Facility-Administered Medications  Medication Dose Route Frequency Provider Last Rate Last Admin   acetaminophen (TYLENOL) tablet 650 mg  650 mg Oral Q6H PRN Sherlon Handing, NP   650 mg at 02/10/21 1706   alum & mag hydroxide-simeth (MAALOX/MYLANTA) 200-200-20 MG/5ML suspension 30 mL  30 mL Oral Q4H PRN Sherlon Handing, NP       LORazepam (ATIVAN) injection 0-4 mg  0-4 mg Intravenous Q6H Sherlon Handing, NP       Or   LORazepam (ATIVAN) tablet 0-4 mg  0-4 mg Oral Q6H Waldon Merl F, NP   1 mg at 02/10/21 2120   [START ON 02/12/2021] LORazepam (ATIVAN) injection 0-4 mg  0-4 mg Intravenous Q12H Sherlon Handing, NP       Or   Derrill Memo ON 02/12/2021] LORazepam (ATIVAN) tablet 0-4 mg  0-4 mg Oral Q12H Barthold, Louise F, NP       magnesium hydroxide (MILK OF MAGNESIA) suspension 30 mL  30 mL Oral Daily PRN Waldon Merl F, NP       nicotine (NICODERM CQ - dosed in mg/24 hours) patch 14 mg  14 mg Transdermal Daily Clapacs, John T, MD   14 mg at 02/11/21 0830   thiamine tablet 100 mg  100 mg Oral Daily Waldon Merl F, NP   100 mg at 02/11/21 1751   Or   thiamine (B-1) injection 100 mg  100 mg Intravenous Daily Waldon Merl F, NP       PTA Medications: Medications Prior to Admission  Medication Sig Dispense Refill Last Dose   amoxicillin-clavulanate (AUGMENTIN) 875-125 MG tablet Take 1 tablet by mouth 2 (two) times daily. 20 tablet 0    buPROPion (WELLBUTRIN XL) 150 MG 24 hr tablet Take 150 mg by mouth daily.      busPIRone (BUSPAR) 7.5 MG tablet Take 7.5 mg by mouth in the morning and at bedtime.      butalbital-acetaminophen-caffeine (FIORICET) 50-325-40 MG tablet Take 1 tablet by  mouth every 6 (six) hours as needed for headache. 14 tablet 0    cetirizine (ZYRTEC) 10 MG tablet Take 10 mg by mouth at bedtime.      FLUoxetine (PROZAC) 40 MG capsule Take 80 mg by mouth daily.      gabapentin (NEURONTIN) 300 MG capsule Take 1 capsule (300 mg total) by mouth at bedtime. (Patient not taking: Reported on 10/20/2020) 10 capsule 2    HYDROcodone-acetaminophen (NORCO/VICODIN) 5-325 MG tablet Take 1-2 tablets by mouth every 6 (six) hours as needed for moderate pain. (Patient not taking: Reported on 10/20/2020) 20 tablet 0    hydrOXYzine (ATARAX/VISTARIL) 50 MG tablet Take 50 mg by mouth at bedtime.      montelukast (SINGULAIR) 10 MG tablet Take 10 mg by mouth at bedtime.      neomycin-polymyxin-hydrocortisone (CORTISPORIN) OTIC solution Place 3 drops into the right ear 4 (four) times daily. X 5-7 days 10 mL 0    ondansetron (ZOFRAN ODT) 4 MG disintegrating tablet Take 1 tablet (4 mg total) by mouth every 8 (eight) hours as needed for nausea or vomiting. 20 tablet 0    ondansetron (ZOFRAN) 4 MG tablet Take 1 tablet (4 mg total) by mouth every 8 (eight) hours as needed  for nausea or vomiting. 20 tablet 0    traZODone (DESYREL) 100 MG tablet Take 100 mg by mouth at bedtime.       Patient Stressors: Loss of sibling    Marital or family conflict   Occupational concerns    Patient Strengths: Average or above average intelligence  Camera operator means  Physical Health  Supportive family/friends  Work skills   Treatment Modalities: Medication Management, Group therapy, Case management,  1 to 1 session with clinician, Psychoeducation, Recreational therapy.   Physician Treatment Plan for Primary Diagnosis: <principal problem not specified> Long Term Goal(s):     Short Term Goals:    Medication Management: Evaluate patient's response, side effects, and tolerance of medication regimen.  Therapeutic Interventions: 1 to 1 sessions, Unit Group sessions and Medication  administration.  Evaluation of Outcomes: Not Met  Physician Treatment Plan for Secondary Diagnosis: Active Problems:   * No active hospital problems. *  Long Term Goal(s):     Short Term Goals:       Medication Management: Evaluate patient's response, side effects, and tolerance of medication regimen.  Therapeutic Interventions: 1 to 1 sessions, Unit Group sessions and Medication administration.  Evaluation of Outcomes: Not Met   RN Treatment Plan for Primary Diagnosis: <principal problem not specified> Long Term Goal(s): Knowledge of disease and therapeutic regimen to maintain health will improve  Short Term Goals: Ability to remain free from injury will improve, Ability to verbalize frustration and anger appropriately will improve, Ability to demonstrate self-control, Ability to participate in decision making will improve, Ability to verbalize feelings will improve, Ability to disclose and discuss suicidal ideas, Ability to identify and develop effective coping behaviors will improve, and Compliance with prescribed medications will improve  Medication Management: RN will administer medications as ordered by provider, will assess and evaluate patient's response and provide education to patient for prescribed medication. RN will report any adverse and/or side effects to prescribing provider.  Therapeutic Interventions: 1 on 1 counseling sessions, Psychoeducation, Medication administration, Evaluate responses to treatment, Monitor vital signs and CBGs as ordered, Perform/monitor CIWA, COWS, AIMS and Fall Risk screenings as ordered, Perform wound care treatments as ordered.  Evaluation of Outcomes: Not Met   LCSW Treatment Plan for Primary Diagnosis: <principal problem not specified> Long Term Goal(s): Safe transition to appropriate next level of care at discharge, Engage patient in therapeutic group addressing interpersonal concerns.  Short Term Goals: Engage patient in aftercare  planning with referrals and resources, Increase social support, Increase ability to appropriately verbalize feelings, Increase emotional regulation, Facilitate acceptance of mental health diagnosis and concerns, Facilitate patient progression through stages of change regarding substance use diagnoses and concerns, Identify triggers associated with mental health/substance abuse issues, and Increase skills for wellness and recovery  Therapeutic Interventions: Assess for all discharge needs, 1 to 1 time with Social worker, Explore available resources and support systems, Assess for adequacy in community support network, Educate family and significant other(s) on suicide prevention, Complete Psychosocial Assessment, Interpersonal group therapy.  Evaluation of Outcomes: Not Met   Progress in Treatment: Attending groups: No. Participating in groups: No. Taking medication as prescribed: Yes. Toleration medication: Yes. Family/Significant other contact made: No, will contact:  CSW will contact collateral once consent has been obtained.  Patient understands diagnosis: Yes. Discussing patient identified problems/goals with staff: Yes. Medical problems stabilized or resolved: Yes. Denies suicidal/homicidal ideation: No. Issues/concerns per patient self-inventory: Yes. Other: none   New problem(s) identified: No, Describe:  No additional problems  identified at this time.   New Short Term/Long Term Goal(s): Patient to work towards  elimination of symptoms of psychosis, medication management for mood stabilization; elimination of SI thoughts; development of comprehensive mental wellness plan.  Patient Goals:  "I guess I need to figure out my meds, talk about some stuff"   Discharge Plan or Barriers: No barriers identified at this time.   Reason for Continuation of Hospitalization: Depression Suicidal ideation  Estimated Length of Stay: TBD    Scribe for Treatment Team: Larose Kells 02/11/2021 10:35 AM

## 2021-02-11 NOTE — Progress Notes (Signed)
Provider Lerry Liner, NP called and made aware pt requesting to have home meds of Trazodone ordered for insomnia. Pt stated she cannot sleep without it and she was only able to sleep last night because she was given Ativan.  Provider responded and gave VORB of Trazodone 100 mg PRN at bedtime for insomnia.

## 2021-02-12 NOTE — Progress Notes (Signed)
Pt has been alert and oriented to person, place, time and situation. Pt is calm, cooperative, pleasant, affect flat, is out for meals, and is medication compliant. Pt denies SI/HI/AVH, denies feelings of depression and anxiety. PT denies any alcohol withdrawal symptoms CIWA=0. Pt is focused on discharge. Pt reported back aches and a headache reporting the cause of that was poor sleep on a bad mattress stating she can't get rest in hospital beds. Will continue to monitor pt per Q15 minute face checks and monitor for safety and progress.

## 2021-02-12 NOTE — Group Note (Signed)
LCSW Group Therapy Note  Group Date: 02/12/2021 Start Time: 1315 End Time: 1415   Type of Therapy and Topic:  Group Therapy - Healthy vs Unhealthy Coping Skills  Participation Level:  Active   Description of Group The focus of this group was to determine what unhealthy coping techniques typically are used by group members and what healthy coping techniques would be helpful in coping with various problems. Patients were guided in becoming aware of the differences between healthy and unhealthy coping techniques. Patients were asked to identify 2-3 healthy coping skills they would like to learn to use more effectively.  Therapeutic Goals Patients learned that coping is what human beings do all day long to deal with various situations in their lives Patients defined and discussed healthy vs unhealthy coping techniques Patients identified their preferred coping techniques and identified whether these were healthy or unhealthy Patients determined 2-3 healthy coping skills they would like to become more familiar with and use more often. Patients provided support and ideas to each other   Summary of Patient Progress:  Patient was present for the entirety of the group session. Patient was an active listener and participated in the topic of discussion. Patient presented as guarded but participated when prompted. Patient was observed completing a worksheet that supplemented group discussion. Patient identified exercise as a coping skill she would like to use more effectively.    Therapeutic Modalities Cognitive Behavioral Therapy Motivational Interviewing  Norberto Sorenson, Theresia Majors 02/12/2021  5:55 PM

## 2021-02-12 NOTE — Progress Notes (Signed)
[facility] MD Progress Note  02/12/2021 2:04 PM Karen Park  MRN:  [id number] Subjective: Follow-up for this 40 year old woman with a history of depression.  Patient seen and chart reviewed.  Patient reports that her mood is feeling better today.  She feels less anxious.  She slept well last night.  Patient has consistently denied suicidal thoughts.  Reports that overall she feels like she is stabilizing.  No complaints regarding medication. Principal Problem: Severe recurrent major depression without psychotic features (HCC) Diagnosis: Principal Problem:   Severe recurrent major depression without psychotic features (HCC)  Total Time spent with patient: 30 minutes  Past Psychiatric History: Past history of recurrent depression  Past Medical History:  Past Medical History:  Diagnosis Date   Anxiety    Arthritis    both knees, fingers, spine   Depression    History of hiatal hernia    repair 2020   History of kidney stones    Migraine    Pneumonia     Past Surgical History:  Procedure Laterality Date   ABDOMINAL HYSTERECTOMY     BARIATRIC SURGERY  01/02/2018   CESAREAN SECTION     CHOLECYSTECTOMY     COLONOSCOPY     dental work     ESOPHAGOGASTRODUODENOSCOPY     exploratory laparoscopic     GASTRIC BYPASS     HERNIA REPAIR     x2   HYSTERECTOMY ABDOMINAL WITH SALPINGECTOMY Bilateral 08/16/2020   Procedure: HYSTERECTOMY ABDOMINAL WITH BILATERAL SALPINGECTOMY  (L) OOPHORECTOMY;  Surgeon: Suzy Bouchard, MD;  Location: [facility] ORS;  Service: Gynecology;  Laterality: Bilateral;   OOPHORECTOMY Left 08/16/2020   Procedure: OOPHORECTOMY;  Surgeon: Schermerhorn, Ihor Austin, MD;  Location: [facility] ORS;  Service: Gynecology;  Laterality: Left;   Family History: History reviewed. No pertinent family history. Family Psychiatric  History: See previous Social History:  Social History   Substance and Sexual Activity  Alcohol Use Not Currently   Alcohol/week: 2.0 standard drinks    Types: 1 Glasses of wine, 1 Cans of beer per week   Comment: rarely     Social History   Substance and Sexual Activity  Drug Use Not Currently    Social History   Socioeconomic History   Marital status: Married    Spouse name: Ethelene Browns   Number of children: 1   Years of education: Not on file   Highest education level: Not on file  Occupational History   Not on file  Tobacco Use   Smoking status: Former    Types: Cigarettes    Quit date: 01/17/2020    Years since quitting: 1.0   Smokeless tobacco: Never  Vaping Use   Vaping Use: Every day   Start date: 12/17/2019   Substances: Flavoring  Substance and Sexual Activity   Alcohol use: Not Currently    Alcohol/week: 2.0 standard drinks    Types: 1 Glasses of wine, 1 Cans of beer per week    Comment: rarely   Drug use: Not Currently   Sexual activity: Yes  Other Topics Concern   Not on file  Social History Narrative   Not on file   Social Determinants of Health   Financial Resource Strain: Not on file  Food Insecurity: Not on file  Transportation Needs: Not on file  Physical Activity: Not on file  Stress: Not on file  Social Connections: Not on file   Additional Social History:  Sleep: Fair  Appetite:  Fair  Current Medications: Current Facility-Administered Medications  Medication Dose Route Frequency Provider Last Rate Last Admin   acetaminophen (TYLENOL) tablet 650 mg  650 mg Oral Q6H PRN Vanetta Mulders, NP   650 mg at 02/12/21 0823   alum & mag hydroxide-simeth (MAALOX/MYLANTA) 200-200-20 MG/5ML suspension 30 mL  30 mL Oral Q4H PRN Gabriel Cirri F, NP       buPROPion (WELLBUTRIN XL) 24 hr tablet 150 mg  150 mg Oral Daily Valeta Paz T, MD   150 mg at 02/12/21 6237   busPIRone (BUSPAR) tablet 7.5 mg  7.5 mg Oral BID Delayza Lungren T, MD   7.5 mg at 02/12/21 6283   FLUoxetine (PROZAC) capsule 80 mg  80 mg Oral Daily Ezrah Panning T, MD   80 mg at 02/12/21 1517    magnesium hydroxide (MILK OF MAGNESIA) suspension 30 mL  30 mL Oral Daily PRN Gabriel Cirri F, NP       nicotine (NICODERM CQ - dosed in mg/24 hours) patch 14 mg  14 mg Transdermal Daily Eithan Beagle T, MD   14 mg at 02/12/21 0825   thiamine tablet 100 mg  100 mg Oral Daily Gabriel Cirri F, NP   100 mg at 02/12/21 6160   Or   thiamine (B-1) injection 100 mg  100 mg Intravenous Daily Gabriel Cirri F, NP       traZODone (DESYREL) tablet 100 mg  100 mg Oral QHS PRN Jearld Lesch, NP   100 mg at 02/11/21 2212    Lab Results: No results found for this or any previous visit (from the past 48 hour(s)).  Blood Alcohol level:  Lab Results  Component Value Date   ETH 117 (H) 02/09/2021    Metabolic Disorder Labs: No results found for: HGBA1C, MPG No results found for: PROLACTIN No results found for: CHOL, TRIG, HDL, CHOLHDL, VLDL, LDLCALC  Physical Findings: AIMS:  , ,  ,  ,    CIWA:  CIWA-Ar Total: 0 COWS:     Musculoskeletal: Strength & Muscle Tone: within normal limits Gait & Station: normal Patient leans: N/A  Psychiatric Specialty Exam:  Presentation  General Appearance: Appropriate for Environment  Eye Contact:Fair  Speech:Slow  Speech Volume:Decreased  Handedness:Right   Mood and Affect  Mood:Depressed  Affect:Depressed; Flat; Blunt; Congruent   Thought Process  Thought Processes:Coherent  Descriptions of Associations:Intact  Orientation:Full (Time, Place and Person)  Thought Content:Logical  History of Schizophrenia/Schizoaffective disorder:No  Duration of Psychotic Symptoms:No data recorded Hallucinations:No data recorded Ideas of Reference:None  Suicidal Thoughts:No data recorded Homicidal Thoughts:No data recorded  Sensorium  Memory:Immediate Fair; Recent Fair; Remote Fair  Judgment:Fair  Insight:Lacking   Executive Functions  Concentration:Fair  Attention Span:Fair  Recall:Fair  Fund of  Knowledge:Fair  Language:Fair   Psychomotor Activity  Psychomotor Activity:No data recorded  Assets  Assets:Communication Skills; Desire for Improvement; Resilience; Social Support   Sleep  Sleep:No data recorded   Physical Exam: Physical Exam Vitals and nursing note reviewed.  Constitutional:      Appearance: Normal appearance.  HENT:     Head: Normocephalic and atraumatic.     Mouth/Throat:     Pharynx: Oropharynx is clear.  Eyes:     Pupils: Pupils are equal, round, and reactive to light.  Cardiovascular:     Rate and Rhythm: Normal rate and regular rhythm.  Pulmonary:     Effort: Pulmonary effort is normal.     Breath sounds: Normal breath  sounds.  Abdominal:     General: Abdomen is flat.     Palpations: Abdomen is soft.  Musculoskeletal:        General: Normal range of motion.  Skin:    General: Skin is warm and dry.  Neurological:     General: No focal deficit present.     Mental Status: She is alert. Mental status is at baseline.  Psychiatric:        Mood and Affect: Mood normal.        Thought Content: Thought content normal.   Review of Systems  Constitutional: Negative.   HENT: Negative.    Eyes: Negative.   Respiratory: Negative.    Cardiovascular: Negative.   Gastrointestinal: Negative.   Musculoskeletal: Negative.   Skin: Negative.   Neurological: Negative.   Psychiatric/Behavioral:  Negative for hallucinations and suicidal ideas. The patient is nervous/anxious.   Blood pressure 106/64, pulse 77, temperature 98 F (36.7 C), temperature source Oral, resp. rate 18, height 5\' 6"  (1.676 m), weight 84.1 kg, last menstrual period 07/30/2020, SpO2 98 %. Body mass index is 29.94 kg/m.   Treatment Plan Summary: Plan no change to medication management.  Psychoeducation and supportive counseling completed.  Reviewed treatment plan.  Likely discharge tomorrow.  08/01/2020, MD 02/12/2021, 2:04 PM

## 2021-02-13 MED ORDER — BUSPIRONE HCL 7.5 MG PO TABS: 7.5000 mg | ORAL_TABLET | Freq: Two times a day (BID) | ORAL | 1 refills | Status: DC

## 2021-02-13 MED ORDER — FLUOXETINE HCL 40 MG PO CAPS: 80.0000 mg | ORAL_CAPSULE | Freq: Every day | ORAL | 1 refills | Status: DC

## 2021-02-13 MED ORDER — HYDROXYZINE HCL 50 MG PO TABS: 50.0000 mg | ORAL_TABLET | Freq: Every day | ORAL | 1 refills | Status: DC

## 2021-02-13 MED ORDER — NICOTINE 14 MG/24HR TD PT24: 14.0000 mg | MEDICATED_PATCH | Freq: Every day | TRANSDERMAL | 0 refills | Status: DC

## 2021-02-13 MED ORDER — TRAZODONE HCL 100 MG PO TABS: 100.0000 mg | ORAL_TABLET | Freq: Every evening | ORAL | 1 refills | Status: DC | PRN

## 2021-02-13 MED ORDER — BUPROPION HCL ER (XL) 150 MG PO TB24
150.0000 mg | ORAL_TABLET | Freq: Every day | ORAL | 1 refills | Status: DC
Start: 2021-02-13 — End: 2023-07-24

## 2021-02-13 NOTE — Discharge Summary (Signed)
Physician Discharge Summary Note  Patient:  Karen Park is an 40 y.o., female MRN:  [id number] DOB:  [birth year] Patient phone:  [phone] (home)  Patient address:   [address] [place] [address],  Total Time spent with patient: 45 minutes  Date of Admission:  02/10/2021 Date of Discharge: 02/13/2021  Reason for Admission: Patient was admitted to the unit after displaying disorganized paranoid behavior potentially dangerous to herself with relapse of depression and anxiety  Principal Problem: Severe recurrent major depression without psychotic features [facility]) Discharge Diagnoses: Principal Problem:   Severe recurrent major depression without psychotic features (HCC)   Past Psychiatric History: Past history of chronic depression and anxiety  Past Medical History:  Past Medical History:  Diagnosis Date   Anxiety    Arthritis    both knees, fingers, spine   Depression    History of hiatal hernia    repair 2020   History of kidney stones    Migraine    Pneumonia     Past Surgical History:  Procedure Laterality Date   ABDOMINAL HYSTERECTOMY     BARIATRIC SURGERY  01/02/2018   CESAREAN SECTION     CHOLECYSTECTOMY     COLONOSCOPY     dental work     ESOPHAGOGASTRODUODENOSCOPY     exploratory laparoscopic     GASTRIC BYPASS     HERNIA REPAIR     x2   HYSTERECTOMY ABDOMINAL WITH SALPINGECTOMY Bilateral 08/16/2020   Procedure: HYSTERECTOMY ABDOMINAL WITH BILATERAL SALPINGECTOMY  (L) OOPHORECTOMY;  Surgeon: Suzy Bouchard, MD;  Location: [facility] ORS;  Service: Gynecology;  Laterality: Bilateral;   OOPHORECTOMY Left 08/16/2020   Procedure: OOPHORECTOMY;  Surgeon: Schermerhorn, Ihor Austin, MD;  Location: [facility] ORS;  Service: Gynecology;  Laterality: Left;   Family History: History reviewed. No pertinent family history. Family Psychiatric  History: See previous Social History:  Social History   Substance and Sexual Activity  Alcohol Use Not Currently    Alcohol/week: 2.0 standard drinks   Types: 1 Glasses of wine, 1 Cans of beer per week   Comment: rarely     Social History   Substance and Sexual Activity  Drug Use Not Currently    Social History   Socioeconomic History   Marital status: Married    Spouse name: Ethelene Browns   Number of children: 1   Years of education: Not on file   Highest education level: Not on file  Occupational History   Not on file  Tobacco Use   Smoking status: Former    Types: Cigarettes    Quit date: 01/17/2020    Years since quitting: 1.0   Smokeless tobacco: Never  Vaping Use   Vaping Use: Every day   Start date: 12/17/2019   Substances: Flavoring  Substance and Sexual Activity   Alcohol use: Not Currently    Alcohol/week: 2.0 standard drinks    Types: 1 Glasses of wine, 1 Cans of beer per week    Comment: rarely   Drug use: Not Currently   Sexual activity: Yes  Other Topics Concern   Not on file  Social History Narrative   Not on file   Social Determinants of Health   Financial Resource Strain: Not on file  Food Insecurity: Not on file  Transportation Needs: Not on file  Physical Activity: Not on file  Stress: Not on file  Social Connections: Not on file    Hospital Course: Patient was admitted to the psychiatric unit and continued on 15-minute  checks.  She did not show any dangerous or aggressive behavior nor did she make any attempts at self-injury.  She was cooperative with the evaluation and treatment plans.  Medication was continued essentially as it was prior to hospitalization.  Patient engaged appropriately with staff and worked on an appropriate discharge plan including outpatient therapy and medication management.  By the time of discharge was denying any suicidal thoughts whatsoever and did not appear to be psychotic.  Psychoeducation provided about mental hygiene and availability of outpatient services.  Prescriptions provided at discharge.  Physical Findings: AIMS:  , ,  ,   ,    CIWA:  CIWA-Ar Total: 0 COWS:     Musculoskeletal: Strength & Muscle Tone: within normal limits Gait & Station: normal Patient leans: N/A   Psychiatric Specialty Exam:  Presentation  General Appearance: Appropriate for Environment  Eye Contact:Fair  Speech:Slow  Speech Volume:Decreased  Handedness:Right   Mood and Affect  Mood:Depressed  Affect:Depressed; Flat; Blunt; Congruent   Thought Process  Thought Processes:Coherent  Descriptions of Associations:Intact  Orientation:Full (Time, Place and Person)  Thought Content:Logical  History of Schizophrenia/Schizoaffective disorder:No  Duration of Psychotic Symptoms:No data recorded Hallucinations:No data recorded Ideas of Reference:None  Suicidal Thoughts:No data recorded Homicidal Thoughts:No data recorded  Sensorium  Memory:Immediate Fair; Recent Fair; Remote Fair  Judgment:Fair  Insight:Lacking   Executive Functions  Concentration:Fair  Attention Span:Fair  Recall:Fair  Fund of Knowledge:Fair  Language:Fair   Psychomotor Activity  Psychomotor Activity:No data recorded  Assets  Assets:Communication Skills; Desire for Improvement; Resilience; Social Support   Sleep  Sleep:No data recorded   Physical Exam: Physical Exam Vitals and nursing note reviewed.  Constitutional:      Appearance: Normal appearance.  HENT:     Head: Normocephalic and atraumatic.     Mouth/Throat:     Pharynx: Oropharynx is clear.  Eyes:     Pupils: Pupils are equal, round, and reactive to light.  Cardiovascular:     Rate and Rhythm: Normal rate and regular rhythm.  Pulmonary:     Effort: Pulmonary effort is normal.     Breath sounds: Normal breath sounds.  Abdominal:     General: Abdomen is flat.     Palpations: Abdomen is soft.  Musculoskeletal:        General: Normal range of motion.  Skin:    General: Skin is warm and dry.  Neurological:     General: No focal deficit present.     Mental  Status: She is alert. Mental status is at baseline.  Psychiatric:        Mood and Affect: Mood normal.        Thought Content: Thought content normal.   Review of Systems  Constitutional: Negative.   HENT: Negative.    Eyes: Negative.   Respiratory: Negative.    Cardiovascular: Negative.   Gastrointestinal: Negative.   Musculoskeletal: Negative.   Skin: Negative.   Neurological: Negative.   Psychiatric/Behavioral: Negative.    Blood pressure 112/66, pulse 77, temperature 98.3 F (36.8 C), temperature source Oral, resp. rate 18, height 5\' 6"  (1.676 m), weight 84.1 kg, last menstrual period 07/30/2020, SpO2 99 %. Body mass index is 29.94 kg/m.   Social History   Tobacco Use  Smoking Status Former   Types: Cigarettes   Quit date: 01/17/2020   Years since quitting: 1.0  Smokeless Tobacco Never   Tobacco Cessation:  A prescription for an FDA-approved tobacco cessation medication provided at discharge   Blood Alcohol level:  Lab Results  Component Value Date   ETH 117 (H) 02/09/2021    Metabolic Disorder Labs:  No results found for: HGBA1C, MPG No results found for: PROLACTIN No results found for: CHOL, TRIG, HDL, CHOLHDL, VLDL, LDLCALC  See Psychiatric Specialty Exam and Suicide Risk Assessment completed by Attending Physician prior to discharge.  Discharge destination:  Home  Is patient on multiple antipsychotic therapies at discharge:  No   Has Patient had three or more failed trials of antipsychotic monotherapy by history:  No  Recommended Plan for Multiple Antipsychotic Therapies: NA  Discharge Instructions     Increase activity slowly   Complete by: As directed    Increase activity slowly   Complete by: As directed       Allergies as of 02/13/2021       Reactions   Other Swelling   Blue cheese.        Medication List     STOP taking these medications    amoxicillin-clavulanate 875-125 MG tablet Commonly known as: Augmentin    neomycin-polymyxin-hydrocortisone OTIC solution Commonly known as: CORTISPORIN   ondansetron 4 MG disintegrating tablet Commonly known as: Zofran ODT   ondansetron 4 MG tablet Commonly known as: Zofran       TAKE these medications      Indication  buPROPion 150 MG 24 hr tablet Commonly known as: WELLBUTRIN XL Take 1 tablet (150 mg total) by mouth daily.  Indication: Major Depressive Disorder   busPIRone 7.5 MG tablet Commonly known as: BUSPAR Take 1 tablet (7.5 mg total) by mouth 2 (two) times daily. What changed: when to take this  Indication: Major Depressive Disorder   butalbital-acetaminophen-caffeine 50-325-40 MG tablet Commonly known as: FIORICET Take 1 tablet by mouth every 6 (six) hours as needed for headache.  Indication: Migraine   cetirizine 10 MG tablet Commonly known as: ZYRTEC Take 10 mg by mouth at bedtime.  Indication: Perennial Allergic Rhinitis   FLUoxetine 40 MG capsule Commonly known as: PROZAC Take 2 capsules (80 mg total) by mouth daily.  Indication: Depression   gabapentin 300 MG capsule Commonly known as: Neurontin Take 1 capsule (300 mg total) by mouth at bedtime.  Indication: Fibromyalgia Syndrome   HYDROcodone-acetaminophen 5-325 MG tablet Commonly known as: NORCO/VICODIN Take 1-2 tablets by mouth every 6 (six) hours as needed for moderate pain.  Indication: Migraine   hydrOXYzine 50 MG tablet Commonly known as: ATARAX Take 1 tablet (50 mg total) by mouth at bedtime.  Indication: Feeling Anxious   montelukast 10 MG tablet Commonly known as: SINGULAIR Take 10 mg by mouth at bedtime.  Indication: Perennial Allergic Rhinitis   nicotine 14 mg/24hr patch Commonly known as: NICODERM CQ - dosed in mg/24 hours Place 1 patch (14 mg total) onto the skin daily. Start taking on: February 14, 2021  Indication: Nicotine Addiction   traZODone 100 MG tablet Commonly known as: DESYREL Take 1 tablet (100 mg total) by mouth at bedtime as  needed for sleep. What changed:  when to take this reasons to take this  Indication: Trouble Sleeping        Follow-up Information     [facility] Go in 1 day(s).   Why: Present to [facility] for walk-in hours Mon-Fri 8am-5pm. Contact information: [address] [place] [address] [phone]                 Follow-up recommendations: Continue current medication and follow up with outpatient mental health providers for therapy  and medication management.  Avoid any alcohol or drug use.  Maintain good sleep schedule.  Obtain help immediately if suicidal thoughts or any disorganized thinking recur.  Patient agrees to plan.  Comments: Prescriptions provided  Signed: Mordecai Rasmussen, MD 02/13/2021, 12:02 PM

## 2021-02-13 NOTE — BHH Counselor (Signed)
CSW provided patient with a list of outpatient psychiatry and counselor providers in the [place] area that accept patient's insurance per patient request.   CSW also informed patient she can follow up for outpatient mental health care with [facility] or [facility]. Patient stated she did not have a preference and was agreeable to follow up at either agency.   No additional concerns at this time.   Albertine Patricia, MSW, [place], [place] 02/13/2021 11:48AM

## 2021-02-13 NOTE — Progress Notes (Signed)
Patient was provided with discharge summary, Transition packet, and Suicide Risk Assessment. Verbalized discharge summary, transition packet and suicide risk assessment, patient made aware of any change in medications, and all upcoming appointments.   Patient belongings returned.   Patient denied any SI, denies plans for self harm or the harm of others. Patient is calm, with appropriate affect and eye contact. Patient reports no complaints at this time.

## 2021-02-13 NOTE — Plan of Care (Signed)
Pt is has appropriate affect and mood on assessment. Denies SI / HI / AVH. Pt is discharge focus and has asked multiple times today when she will be discharged.

## 2021-02-13 NOTE — BHH Counselor (Signed)
  [facility] Adult Case Management Discharge Plan :  Will you be returning to the same living situation after discharge:  Yes,  pt to return home At discharge, do you have transportation home?: Yes,  Pt's husband to provide transportation. Do you have the ability to pay for your medications: Yes,  patient is insured through [address] and [address].  Release of information consent forms completed and in the chart;  Patient's signature needed at discharge.  Patient to Follow up at:  Follow-up Information     [facility] Go in 1 day(s).   Why: Present to [facility] for walk-in hours Mon-Fri 8am-5pm. Contact information: [address] [facility] [address] [phone]                 Next level of care provider has access to [facility] Link:no  Safety Planning and Suicide Prevention discussed: Yes,  SPE completed with pt's husband.    Has patient been referred to the Quitline?: Patient refused referral  Patient has been referred for addiction treatment: Pt. refused referral  Norberto Sorenson, LCSWA 02/13/2021, 12:27 PM

## 2021-02-13 NOTE — Progress Notes (Signed)
   02/13/21 0000  Psych Admission Type (Psych Patients Only)  Admission Status Involuntary  Psychosocial Assessment  Patient Complaints Sleep disturbance  Eye Contact Fair  Facial Expression Flat  Affect Sad  Speech Unremarkable  Interaction Minimal  Motor Activity Other (Comment) (WNL)  Appearance/Hygiene Unremarkable  Behavior Characteristics Cooperative  Mood Pleasant  Thought Process  Coherency WDL  Content WDL  Delusions None reported or observed  Perception WDL  Hallucination None reported or observed  Judgment WDL  Confusion None  Danger to Self  Current suicidal ideation? Denies  Danger to Others  Danger to Others None reported or observed  Patient compliant with treatment Denies SI.HI/A/VH and verbally contracted for safety. Support and encouragement provided as needed.

## 2021-02-13 NOTE — BHH Suicide Risk Assessment (Signed)
[facility] Discharge Suicide Risk Assessment   Principal Problem: Severe recurrent major depression without psychotic features [facility]) Discharge Diagnoses: Principal Problem:   Severe recurrent major depression without psychotic features (Karen Park)   Total Time spent with patient: 45 minutes  Musculoskeletal: Strength & Muscle Tone: within normal limits Gait & Station: normal Patient leans: N/A  Psychiatric Specialty Exam  Presentation  General Appearance: Appropriate for Environment  Eye Contact:Fair  Speech:Slow  Speech Volume:Decreased  Handedness:Right   Mood and Affect  Mood:Depressed  Duration of Depression Symptoms: Greater than two weeks  Affect:Depressed; Flat; Blunt; Congruent   Thought Process  Thought Processes:Coherent  Descriptions of Associations:Intact  Orientation:Full (Time, Place and Person)  Thought Content:Logical  History of Schizophrenia/Schizoaffective disorder:No  Duration of Psychotic Symptoms:No data recorded Hallucinations:No data recorded Ideas of Reference:None  Suicidal Thoughts:No data recorded Homicidal Thoughts:No data recorded  Sensorium  Memory:Immediate Fair; Recent Fair; Remote Fair  Judgment:Fair  Insight:Lacking   Executive Functions  Concentration:Fair  Attention Span:Fair  [place]   Psychomotor Activity  Psychomotor Activity:No data recorded  Assets  Assets:Communication Skills; Desire for Improvement; Resilience; Social Support   Sleep  Sleep:No data recorded  Physical Exam: Physical Exam Vitals and nursing note reviewed.  Constitutional:      Appearance: Normal appearance.  HENT:     Head: Normocephalic and atraumatic.     Mouth/Throat:     Pharynx: Oropharynx is clear.  Eyes:     Pupils: Pupils are equal, round, and reactive to light.  Cardiovascular:     Rate and Rhythm: Normal rate and regular rhythm.  Pulmonary:     Effort: Pulmonary effort is  normal.     Breath sounds: Normal breath sounds.  Abdominal:     General: Abdomen is flat.     Palpations: Abdomen is soft.  Musculoskeletal:        General: Normal range of motion.  Skin:    General: Skin is warm and dry.  Neurological:     General: No focal deficit present.     Mental Status: She is alert. Mental status is at baseline.  Psychiatric:        Attention and Perception: Attention normal.        Mood and Affect: Mood normal.        Speech: Speech normal.        Behavior: Behavior is cooperative.        Thought Content: Thought content normal.        Cognition and Memory: Cognition normal.        Judgment: Judgment normal.   Review of Systems  Constitutional: Negative.   HENT: Negative.    Eyes: Negative.   Respiratory: Negative.    Cardiovascular: Negative.   Gastrointestinal: Negative.   Musculoskeletal: Negative.   Skin: Negative.   Neurological: Negative.   Psychiatric/Behavioral: Negative.    Blood pressure 112/66, pulse 77, temperature 98.3 F (36.8 C), temperature source Oral, resp. rate 18, height _0  (1.676 m), weight 84.1 kg, last menstrual period 07/30/2020, SpO2 99 %. Body mass index is 29.94 kg/m.  Mental Status Per Nursing Assessment::   On Admission:  NA  Demographic Factors:  Caucasian  Loss Factors: NA  Historical Factors: Impulsivity  Risk Reduction Factors:   Sense of responsibility to family, Living with another person, especially a relative, Positive social support, Positive therapeutic relationship, and Positive coping skills or problem solving skills  Continued Clinical Symptoms:  Severe Anxiety and/or Agitation Depression:  Impulsivity  Cognitive Features That Contribute To Risk:  None    Suicide Risk:  Minimal: No identifiable suicidal ideation.  Patients presenting with no risk factors but with morbid ruminations; may be classified as minimal risk based on the severity of the depressive symptoms   Follow-up  Information     [facility] in 1 day(s).   Why: Present to [facility] for walk-in hours Mon-Fri 8am-5pm. Contact information: [place] [address] [phone]                 Plan Of Care/Follow-up recommendations:  Activity as tolerated.  Engage with outpatient treatment.  Social worker met with patient and gave multiple referrals for therapy and medication management.  Continue current medicine.  Avoid any alcohol or drug use.  Maintain good sleep schedule.  Alethia Berthold, MD 02/13/2021, 11:55 AM

## 2021-03-21 ENCOUNTER — Encounter: Payer: Self-pay | Admitting: Licensed Clinical Social Worker

## 2021-03-21 ENCOUNTER — Ambulatory Visit (INDEPENDENT_AMBULATORY_CARE_PROVIDER_SITE_OTHER): Payer: BLUE CROSS/BLUE SHIELD | Admitting: Licensed Clinical Social Worker

## 2021-03-21 ENCOUNTER — Other Ambulatory Visit: Payer: Self-pay

## 2021-03-21 DIAGNOSIS — F332 Major depressive disorder, recurrent severe without psychotic features: Secondary | ICD-10-CM | POA: Diagnosis not present
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
## 2021-03-23 NOTE — Plan of Care (Signed)
Developed tx plan with pt input  

## 2021-04-06 ENCOUNTER — Encounter: Payer: Self-pay | Admitting: Medical

## 2021-04-06 ENCOUNTER — Other Ambulatory Visit: Payer: Self-pay

## 2021-04-06 ENCOUNTER — Ambulatory Visit: Admitting: Medical

## 2021-04-06 VITALS — BP 122/74 | HR 79 | Temp 97.6°F | Resp 18

## 2021-04-06 DIAGNOSIS — G43909 Migraine, unspecified, not intractable, without status migrainosus: Secondary | ICD-10-CM

## 2021-04-06 DIAGNOSIS — J0110 Acute frontal sinusitis, unspecified: Secondary | ICD-10-CM

## 2021-04-06 DIAGNOSIS — Z20822 Contact with and (suspected) exposure to covid-19: Secondary | ICD-10-CM

## 2021-04-06 DIAGNOSIS — R110 Nausea: Secondary | ICD-10-CM

## 2021-04-06 LAB — POC COVID19 BINAXNOW: SARS Coronavirus 2 Ag: NEGATIVE

## 2021-04-06 MED ORDER — AMOXICILLIN-POT CLAVULANATE 875-125 MG PO TABS: 1.0000 | ORAL_TABLET | Freq: Two times a day (BID) | ORAL | 0 refills | Status: DC

## 2021-04-06 MED ORDER — ONDANSETRON 4 MG PO TBDP
4.0000 mg | ORAL_TABLET | Freq: Once | ORAL | Status: AC
  Administered 2021-04-06: 10:00:00 4 mg via ORAL

## 2021-04-06 NOTE — Progress Notes (Signed)
° °  Subjective:    Patient ID: Charlott Rakes, female    DOB: 08/25/1980, 41 y.o.   MRN: [id number]  HPI 41 yo female with migraine starting with aching yesterday. This is not why she is coming to the clinic.She is very nauseous. She took a Geophysicist/field seismologist and zofran ( at 5am). She feels the migraine was triggered by the weather.  She is here with sinus symptoms starting on Thursday or Friday of last week. He whole face aches. She has yellow green discharge out of her nose with is running and congested.  Took OTC sinus and cold symptom relief with little relief. Blood pressure 122/74, pulse 79, temperature 97.6 F (36.4 C), temperature source Tympanic, resp. rate 18, last menstrual period 07/30/2020, SpO2 97 %. Allergies  Allergen Reactions   Other Swelling    Blue cheese.     Review of Systems  Constitutional:  Negative for chills and fever.  Eyes:  Positive for photophobia.  Gastrointestinal:  Positive for nausea.  Neurological:  Positive for headaches.      Objective:   Physical Exam Vitals and nursing note reviewed.  Constitutional:      Appearance: Normal appearance.  HENT:     Head: Normocephalic and atraumatic.     Right Ear: Ear canal and external ear normal.     Left Ear: Tympanic membrane, ear canal and external ear normal.     Nose: Congestion present.     Mouth/Throat:     Mouth: Mucous membranes are moist.     Pharynx: Oropharynx is clear. Posterior oropharyngeal erythema (mild) present.  Neurological:     Mental Status: She is alert.     Offered Toradol injection however patient has had gastric bypass and problem with adhesions, with a risk of Prozac and Toradol increasing risk of GI bleed we decided not to do the Toradol.  Patient treated with Zofran  4 mg ODT by mouth which improved her nausea.      Assessment & Plan:  Sinusitis Migraine with nausea. She has Zofran at home. Meds ordered this encounter  Medications   amoxicillin-clavulanate (AUGMENTIN)  875-125 MG tablet    Sig: Take 1 tablet by mouth 2 (two) times daily. Take with food    Dispense:  20 tablet    Refill:  0   Rest, increase fluids, f/u in  3-5 days if not improving . Patient verbalizes understanding and has no questions at discharge. Work note provided for patient today.

## 2021-04-06 NOTE — Patient Instructions (Signed)

## 2021-04-06 NOTE — Addendum Note (Signed)
Addended by: Asher Muir A on: 04/06/2021 11:15 AM   Modules accepted: Orders

## 2021-04-25 ENCOUNTER — Other Ambulatory Visit: Payer: Self-pay

## 2021-04-25 ENCOUNTER — Ambulatory Visit (INDEPENDENT_AMBULATORY_CARE_PROVIDER_SITE_OTHER): Payer: BLUE CROSS/BLUE SHIELD | Admitting: Licensed Clinical Social Worker

## 2021-04-25 DIAGNOSIS — F332 Major depressive disorder, recurrent severe without psychotic features: Secondary | ICD-10-CM

## 2021-04-25 NOTE — Progress Notes (Signed)
Virtual Visit via Video Note  I connected with Karen Park on 04/25/21 at  4:00 PM EST by a video enabled telemedicine application and verified that I am speaking with the correct person using two identifiers.  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 The patient expressed understanding and agreed to proceed.   I discussed the assessment and treatment plan with the patient. The patient was provided an opportunity to ask questions and all were answered. The patient agreed with the plan and demonstrated an understanding of the instructions.   The patient was advised to call back or seek an in-person evaluation if the symptoms worsen or if the condition fails to improve as anticipated.  I provided 40 minutes of non-face-to-face time during this encounter.   Karen Trigo R Winry Egnew, LCSW   THERAPIST PROGRESS NOTE  Session Time: 61-440p  Participation Level: Active  Behavioral Response: Neat and Well GroomedAlertDepressed  Type of Therapy: Individual Therapy  Treatment Goals addressed:  Problem: Depression CCP Problem  1 Decrease depressive symptoms and improve levels of effective functioning   Goal: LTG: Reduce frequency, intensity, and duration of depression symptoms as evidenced by: SSB input needed on appropriate metric  Outcome: Progressing  Goal: STG: Sundeep WILL PARTICIPATE IN AT LEAST 80% OF SCHEDULED INDIVIDUAL PSYCHOTHERAPY SESSIONS  Outcome: Progressing Interventions:  Intervention: Encourage verbalization of feelings/concerns/expectations  Intervention: Encourage compliance with prescribed medication regimen  Intervention: Assess/Monitor for acute and/or chronic pain Summary: Karen Park is a 41 y.o. female who presents with improving symptoms related to depression diagnosis. Pt reports that overall mood is  stable and that she is managing stress and anxiety well. Pt reports good quality and quantity of sleep. Pt reports that vocational stress is at a minimum and that family relationships are good.  .   Suicidal/Homicidal: No  Therapist Response: Pt is continuing to apply interventions learned in session into daily life situations. Pt is currently on track to meet goals utilizing interventions mentioned above. Personal growth and progress noted. Treatment to continue as indicated.    Plan: Return again in 4 weeks.  Diagnosis: Axis I: MDD, recurrent    Axis II: No diagnosis    Karen Haber Glorimar Stroope, LCSW 04/25/2021

## 2021-04-25 NOTE — Plan of Care (Signed)
°  Problem: Depression CCP Problem  1 Decrease depressive symptoms and improve levels of effective functioning  Goal: LTG: Reduce frequency, intensity, and duration of depression symptoms as evidenced by: SSB input needed on appropriate metric Outcome: Progressing Goal: STG: [place] WILL PARTICIPATE IN AT LEAST 80% OF SCHEDULED INDIVIDUAL PSYCHOTHERAPY SESSIONS Outcome: Progressing Intervention: Encourage verbalization of feelings/concerns/expectations Intervention: Encourage compliance with prescribed medication regimen Intervention: Assess/Monitor for acute and/or chronic pain

## 2021-05-25 ENCOUNTER — Other Ambulatory Visit: Payer: Self-pay

## 2021-05-25 ENCOUNTER — Encounter: Payer: Self-pay | Admitting: Medical

## 2021-05-25 ENCOUNTER — Ambulatory Visit: Payer: BLUE CROSS/BLUE SHIELD | Admitting: Medical

## 2021-05-25 VITALS — BP 128/70 | HR 91 | Temp 97.7°F | Resp 16

## 2021-05-25 DIAGNOSIS — H6983 Other specified disorders of Eustachian tube, bilateral: Secondary | ICD-10-CM

## 2021-05-25 DIAGNOSIS — J301 Allergic rhinitis due to pollen: Secondary | ICD-10-CM

## 2021-05-25 MED ORDER — PREDNISONE 10 MG (21) PO TBPK: ORAL_TABLET | ORAL | 0 refills | Status: DC

## 2021-05-25 NOTE — Patient Instructions (Signed)
Prednisone Tablets ?What is this medication? ?PREDNISONE (PRED ni sone) treats many conditions such as asthma, allergic reactions, arthritis, inflammatory bowel diseases, adrenal, and blood or bone marrow disorders. It works by decreasing inflammation, slowing down an overactive immune system, or replacing cortisol normally made in the body. Cortisol is a hormone that plays an important role in how the body responds to stress, illness, and injury. It belongs to a group of medications called steroids. ?This medicine may be used for other purposes; ask your health care provider or pharmacist if you have questions. ?COMMON BRAND NAME(S): Deltasone, Predone, Sterapred, Sterapred DS ?What should I tell my care team before I take this medication? ?They need to know if you have any of these conditions: ?Cushing's syndrome ?Diabetes ?Glaucoma ?Heart disease ?High blood pressure ?Infection (especially a virus infection such as chickenpox, cold sores, or herpes) ?Kidney disease ?Liver disease ?Mental illness ?Myasthenia gravis ?Osteoporosis ?Seizures ?Stomach or intestine problems ?Thyroid disease ?An unusual or allergic reaction to lactose, prednisone, other medications, foods, dyes, or preservatives ?Pregnant or trying to get pregnant ?Breast-feeding ?How should I use this medication? ?Take this medication by mouth with a glass of water. Follow the directions on the prescription label. Take this medication with food. If you are taking this medication once a day, take it in the morning. Do not take more medication than you are told to take. Do not suddenly stop taking your medication because you may develop a severe reaction. Your care team will tell you how much medication to take. If your care team wants you to stop the medication, the dose may be slowly lowered over time to avoid any side effects. ?Talk to your care team about the use of this medication in children. Special care may be needed. ?Overdosage: If you think  you have taken too much of this medicine contact a poison control center or emergency room at once. ?NOTE: This medicine is only for you. Do not share this medicine with others. ?What if I miss a dose? ?If you miss a dose, take it as soon as you can. If it is almost time for your next dose, talk to your care team. You may need to miss a dose or take an extra dose. Do not take double or extra doses without advice. ?What may interact with this medication? ?Do not take this medication with any of the following: ?Metyrapone ?Mifepristone ?This medication may also interact with the following: ?Aminoglutethimide ?Amphotericin B ?Aspirin and aspirin-like medications ?Barbiturates ?Certain medications for diabetes, like glipizide or glyburide ?Cholestyramine ?Cholinesterase inhibitors ?Cyclosporine ?Digoxin ?Diuretics ?Ephedrine ?Female hormones, like estrogens and birth control pills ?Isoniazid ?Ketoconazole ?NSAIDS, medications for pain and inflammation, like ibuprofen or naproxen ?Phenytoin ?Rifampin ?Toxoids ?Vaccines ?Warfarin ?This list may not describe all possible interactions. Give your health care provider a list of all the medicines, herbs, non-prescription drugs, or dietary supplements you use. Also tell them if you smoke, drink alcohol, or use illegal drugs. Some items may interact with your medicine. ?What should I watch for while using this medication? ?Visit your care team for regular checks on your progress. If you are taking this medication over a prolonged period, carry an identification card with your name and address, the type and dose of your medication, and your care team's name and address. ?This medication may increase your risk of getting an infection. Tell your care team if you are around anyone with measles or chickenpox, or if you develop sores or blisters that do not heal properly. ?  If you are going to have surgery, tell your care team that you have taken this medication within the last twelve  months. ?Ask your care team about your diet. You may need to lower the amount of salt you eat. ?This medication may increase blood sugar. Ask your care team if changes in diet or medications are needed if you have diabetes. ?What side effects may I notice from receiving this medication? ?Side effects that you should report to your care team as soon as possible: ?Allergic reactions--skin rash, itching, hives, swelling of the face, lips, tongue, or throat ?Cushing syndrome--increased fat around the midsection, upper back, neck, or face, pink or purple stretch marks on the skin, thinning, fragile skin that easily bruises, unexpected hair growth ?High blood sugar (hyperglycemia)--increased thirst or amount of urine, unusual weakness or fatigue, blurry vision ?Increase in blood pressure ?Infection--fever, chills, cough, sore throat, wounds that don't heal, pain or trouble when passing urine, general feeling of discomfort or being unwell ?Low adrenal gland function--nausea, vomiting, loss of appetite, unusual weakness or fatigue, dizziness ?Mood and behavior changes--anxiety, nervousness, confusion, hallucinations, irritability, hostility, thoughts of suicide or self-harm, worsening mood, feelings of depression ?Stomach bleeding--bloody or black, tar-like stools, vomiting blood or brown material that looks like coffee grounds ?Swelling of the ankles, hands, or feet ?Side effects that usually do not require medical attention (report to your care team if they continue or are bothersome): ?Acne ?General discomfort and fatigue ?Headache ?Increase in appetite ?Nausea ?Trouble sleeping ?Weight gain ?This list may not describe all possible side effects. Call your doctor for medical advice about side effects. You may report side effects to FDA at 1-800-FDA-1088. ?Where should I keep my medication? ?Keep out of the reach of children. ?Store at room temperature between 15 and 30 degrees C (59 and 86 degrees F). Protect from light.  Keep container tightly closed. Throw away any unused medication after the expiration date. ?NOTE: This sheet is a summary. It may not cover all possible information. If you have questions about this medicine, talk to your doctor, pharmacist, or health care provider. ?? 2022 Elsevier/Gold Standard (2020-05-28 00:00:00) ?Eustachian Tube Dysfunction ?Eustachian tube dysfunction refers to a condition in which a blockage develops in the narrow passage that connects the middle ear to the back of the nose (eustachian tube). The eustachian tube regulates air pressure in the middle ear by letting air move between the ear and nose. It also helps to drain fluid from the middle ear space. ?Eustachian tube dysfunction can affect one or both ears. When the eustachian tube does not function properly, air pressure, fluid, or both can build up in the middle ear. ?What are the causes? ?This condition occurs when the eustachian tube becomes blocked or cannot open normally. Common causes of this condition include: ?Ear infections. ?Colds and other infections that affect the nose, mouth, and throat (upper respiratory tract). ?Allergies. ?Irritation from cigarette smoke. ?Irritation from stomach acid coming up into the esophagus (gastroesophageal reflux). The esophagus is the part of the body that moves food from the mouth to the stomach. ?Sudden changes in air pressure, such as from descending in an airplane or scuba diving. ?Abnormal growths in the nose or throat, such as: ?Growths that line the nose (nasal polyps). ?Abnormal growth of cells (tumors). ?Enlarged tissue at the back of the throat (adenoids). ?What increases the risk? ?You are more likely to develop this condition if: ?You smoke. ?You are overweight. ?You are a child who has: ?Certain birth defects  of the mouth, such as cleft palate. ?Large tonsils or adenoids. ?What are the signs or symptoms? ?Common symptoms of this condition include: ?A feeling of fullness in the  ear. ?Ear pain. ?Clicking or popping noises in the ear. ?Ringing in the ear (tinnitus). ?Hearing loss. ?Loss of balance. ?Dizziness. ?Symptoms may get worse when the air pressure around you changes, such as w

## 2021-05-25 NOTE — Progress Notes (Signed)
? ?  Subjective:  ? ? Patient ID: Paiton Boultinghouse, female    DOB: 1980-10-03, 41 y.o.   MRN: [id number] ? ?HPI ?41 yo female in non acute distress. Thinks she lost an earring into her ear canal. ? ? ?Review of Systems  ?All other systems reviewed and are negative. ? ?   ?Objective:  ? Physical Exam ?Vitals and nursing note reviewed.  ?HENT:  ?   Right Ear: Ear canal and external ear normal. A middle ear effusion is present.  ?   Left Ear: Ear canal and external ear normal. A middle ear effusion is present.  ?   Ears:  ?   Comments: No foreign body in ear canal (left) or noted in auricle of ear. ? ? ? ? ? ?   ?Assessment & Plan:  ?Eustachian tube dysfunction bilaterally ?Allergic Rhinitis restart nasocort. ?Meds ordered this encounter  ?Medications  ? predniSONE (STERAPRED UNI-PAK 21 TAB) 10 MG (21) TBPK tablet  ?  Sig: Take  6 tablet today by mouth then  5 tablets tomorrow then one less tablet every day thereafter. Take with food.  ?  Dispense:  21 tablet  ?  Refill:  0  ?  ?Follow up with your PCP as needed. ?

## 2021-06-07 ENCOUNTER — Ambulatory Visit (INDEPENDENT_AMBULATORY_CARE_PROVIDER_SITE_OTHER): Payer: BLUE CROSS/BLUE SHIELD | Admitting: Licensed Clinical Social Worker

## 2021-06-07 ENCOUNTER — Other Ambulatory Visit: Payer: Self-pay

## 2021-06-07 DIAGNOSIS — F332 Major depressive disorder, recurrent severe without psychotic features: Secondary | ICD-10-CM

## 2021-06-07 NOTE — Plan of Care (Signed)
?  Problem: Depression CCP Problem  1 Decrease depressive symptoms and improve levels of effective functioning  ?Goal: LTG: Reduce frequency, intensity, and duration of depression symptoms as evidenced by: SSB input needed on appropriate metric ?Outcome: Progressing ?Goal: STG: Japji WILL PARTICIPATE IN AT LEAST 80% OF SCHEDULED INDIVIDUAL PSYCHOTHERAPY SESSIONS ?Outcome: Progressing ?Intervention: Encourage verbalization of feelings/concerns/expectations ?Intervention: Continue cognitive behavioral therapy for trauma-focused  ?Intervention: EDUCATE Tamar ON COMMON REACTIONS TO A TRAUMATIC EXPERIENCE ?Intervention: Assist with relaxation techniques, as appropriate (deep breathing exercises, meditation, guided imagery) ?Intervention: Provide grief and bereavement support ?  ?

## 2021-06-07 NOTE — Progress Notes (Signed)
Virtual Visit via Video Note ? ?I connected with Karen Park on 06/07/21 at  3:00 PM EDT by a video enabled telemedicine application and verified that I am speaking with the correct person using two identifiers. ? ?Location: ?Patient: home  ?Provider: remote office [place], [place]) ?  ?I discussed the limitations of evaluation and management by telemedicine and the availability of in person appointments. The patient expressed understanding and agreed to proceed. ?  ?I discussed the assessment and treatment plan with the patient. The patient was provided an opportunity to ask questions and all were answered. The patient agreed with the plan and demonstrated an understanding of the instructions. ?  ?The patient was advised to call back or seek an in-person evaluation if the symptoms worsen or if the condition fails to improve as anticipated. ? ?I provided 55 minutes of non-face-to-face time during this encounter. ? ? ?Kynnadi Dicenso R Charnee Turnipseed, LCSW ? ? ?THERAPIST PROGRESS NOTE ? ?Session Time: 3-355p ? ?Participation Level: Active ? ?Behavioral Response: Neat and Well GroomedAlertAnxious ? ?Type of Therapy: Individual Therapy ? ?Treatment Goals addressed: Problem: Depression CCP Problem  1 Decrease depressive symptoms and improve levels of effective functioning  ? ?Goal: LTG: Reduce frequency, intensity, and duration of depression symptoms as evidenced by: SSB input needed on appropriate metric ?Outcome: Progressing ? ?Goal: STG: Iva WILL PARTICIPATE IN AT LEAST 80% OF SCHEDULED INDIVIDUAL PSYCHOTHERAPY SESSIONS ?Outcome: Progressing ? ?ProgressTowards Goals: Progressing ? ?Interventions: Supportive and Other: Trauma focused, Bereavement counseling ? ?Summary: Karen Park is a 41 y.o. female who presents with improving symptoms related to depression diagnosis. Pt reports that overall mood has been stable and that she is managing situational stressors well. Pt reports that she is compliant with her medication.  Pt reports good quality and quantity of sleep.  ? ?Allowed pt to explore and express thoughts and feelings associated with recent life situations and external stressors. ? ?Patient reports that today is the anniversary of her brother's death by suicide. Patient reports that she is feeling a whirlwind of emotions including anger, sadness, frustration. Allowed patient to explore her thoughts and feelings associated with the memories of that day. Patient was able to explore the memories in detail. Patient was able to express thoughts that she was thinking and feelings that she was experiencing in the moment. Patient reports that she is managing the day much better than she originally anticipated. ? ?Patient reports that she is trying hard to use journaling as an intervention to help her express her thoughts and feelings. Patient has a new journal that she recently purchased, and admits that she is going to try more often to use it. Patient reports that she also enjoys reading books, drinking water, and watching basketball as self-care activities. Patient enjoys spending time with her dog and cats. Patient reports that she recently had a very positive experience with her husband, and wants to do more of that as bonding experiences. Pt is happy that her husband has decided to attend church with her.  ? ?Discussed patient's thoughts and feelings about the workplace--patient enjoys working at [employer] Patient feels that she has a good connection with the college students, and with her colleagues. Pt states that [employer] Deis is very big on wellness, so pt is very familiar with importance of overall wellness and self care.  ? ?Continued recommendations are as follows: self care behaviors, positive social engagements, focusing on overall work/home/life balance, and focusing on positive physical and emotional wellness.  ? ?  Suicidal/Homicidal: No. Pt denies suicidal ideation but says that she still has negative intrusive  thoughts regarding body image. Discussed ways that patient can be more confident about self and overall image. ? ?Therapist Response: Pt is continuing to apply interventions learned in session into daily life situations. Pt is currently on track to meet goals utilizing interventions mentioned above. Personal growth and progress noted. Treatment to continue as indicated.  ? ?Plan: Return again in 4 weeks. ? 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 ?Collaboration of Care: Other n/a at time of session ? ?Patient/Guardian was advised Release of Information must be obtained prior to any record release in order to collaborate their care with an outside provider. Patient/Guardian was advised if they have not already done so to contact the registration department to sign all necessary forms in order for [place] to release information regarding their care.  ? ?Consent: Patient/Guardian gives verbal consent for treatment and assignment of benefits for services provided during this visit. Patient/Guardian expressed understanding and agreed to proceed.  ? ?Oaklan Persons R Tillie Viverette, LCSW ?06/07/2021 ? ?

## 2021-06-21 ENCOUNTER — Telehealth: Payer: Self-pay | Admitting: Licensed Clinical Social Worker

## 2021-06-21 NOTE — Telephone Encounter (Signed)
Received call from person identifying self as patient's husband requesting an appointment with therapist  earlier than what is currently scheduled. Stated patient has had a "couple of episodes" and he wanted to see if she could get in earlier to see therapist. Requested that caller have the patient call to schedule this as standard protocol. No ROI found in record that permits acknowledging patient is active in this practice or to speak with husband. Will await call back and route to therapist. ?

## 2021-07-21 ENCOUNTER — Telehealth: Payer: Self-pay | Admitting: Licensed Clinical Social Worker

## 2021-07-21 ENCOUNTER — Ambulatory Visit (INDEPENDENT_AMBULATORY_CARE_PROVIDER_SITE_OTHER): Payer: Self-pay | Admitting: Licensed Clinical Social Worker

## 2021-07-21 DIAGNOSIS — Z91199 Patient's noncompliance with other medical treatment and regimen due to unspecified reason: Secondary | ICD-10-CM

## 2021-07-21 NOTE — Telephone Encounter (Signed)
LCSW counselor tried to connect with patient for scheduled appointment via MyChart video text request x 2 and email request; also tried to connect via phone without success. LCSW counselor left message for patient to call office number to reschedule OPT appointment.  ? ?Attempt 1: Text and email: 1:04p ? ?Attempt 2: Text and email: 1:09p ? ?Attempt 3: phone call: 1:15pm left message ? ? ?Visit coded NS ?

## 2021-07-21 NOTE — Progress Notes (Signed)
LCSW counselor tried to connect with patient for scheduled appointment via MyChart video text request x 2 and email request; also tried to connect via phone without success. LCSW counselor left message for patient to call office number to reschedule OPT appointment.  ? ?Attempt 1: Text and email: 1:04p ? ?Attempt 2: Text and email: 1:09p ? ?Attempt 3: phone call: 1:15pm left message ? ? ?Visit coded NS ?

## 2021-10-18 ENCOUNTER — Ambulatory Visit: Admission: EM | Admit: 2021-10-18 | Discharge: 2021-10-18 | Disposition: A | Discharge status: Home / Self Care

## 2021-10-18 ENCOUNTER — Encounter: Payer: Self-pay | Admitting: Emergency Medicine

## 2021-10-18 DIAGNOSIS — H938X3 Other specified disorders of ear, bilateral: Secondary | ICD-10-CM

## 2021-10-18 DIAGNOSIS — L538 Other specified erythematous conditions: Secondary | ICD-10-CM

## 2021-10-18 NOTE — ED Provider Notes (Signed)
Renaldo Fiddler    CSN: [id number] Arrival date & time: 10/18/21  1915      History   Chief Complaint Chief Complaint  Patient presents with   Otalgia    HPI Karen Park is a 41 y.o. female.   HPI Patient with a history of eustachian tube dysfunction presents today with a three week history of ear fullness, itchiness, ear canal dryness, and area of irritation on external auricle of the ear. Denies fever. Past Medical History:  Diagnosis Date   Anxiety    Arthritis    both knees, fingers, spine   Depression    History of hiatal hernia    repair 2020   History of kidney stones    Migraine    Pneumonia     Patient Active Problem List   Diagnosis Date Noted   Severe recurrent major depression without psychotic features (HCC) 02/11/2021   MDD (major depressive disorder), recurrent episode, moderate (HCC) 02/10/2021   Alcohol abuse 02/10/2021   Menorrhagia 08/16/2020   Post-operative state 08/16/2020    Past Surgical History:  Procedure Laterality Date   ABDOMINAL HYSTERECTOMY     BARIATRIC SURGERY  01/02/2018   CESAREAN SECTION     CHOLECYSTECTOMY     COLONOSCOPY     dental work     ESOPHAGOGASTRODUODENOSCOPY     exploratory laparoscopic     GASTRIC BYPASS     HERNIA REPAIR     x2   HYSTERECTOMY ABDOMINAL WITH SALPINGECTOMY Bilateral 08/16/2020   Procedure: HYSTERECTOMY ABDOMINAL WITH BILATERAL SALPINGECTOMY  (L) OOPHORECTOMY;  Surgeon: Suzy Bouchard, MD;  Location: [facility];  Service: Gynecology;  Laterality: Bilateral;   OOPHORECTOMY Left 08/16/2020   Procedure: OOPHORECTOMY;  Surgeon: Schermerhorn, Ihor Austin, MD;  Location: [facility] OR[facility];  Service: Gynecology;  Laterality: Left;    OB History   No obstetric history on file.      Home Medications    Prior to Admission medications   Medication Sig Start Date End Date Taking? Authorizing Provider  amoxicillin-clavulanate (AUGMENTIN) 875-125 MG tablet Take 1 tablet by mouth 2 (two) times  daily. Take with food 04/06/21   Ratcliffe, Heather R, PA-C  buPROPion (WELLBUTRIN XL) 150 MG 24 hr tablet Take 1 tablet (150 mg total) by mouth daily. 02/13/21   Clapacs, Jackquline Denmark, MD  busPIRone (BUSPAR) 7.5 MG tablet Take 1 tablet (7.5 mg total) by mouth 2 (two) times daily. 02/13/21   Clapacs, Jackquline Denmark, MD  butalbital-acetaminophen-caffeine (FIORICET) [phone] MG tablet Take 1 tablet by mouth every 6 (six) hours as needed for headache. 11/04/20 11/04/21  Willy Eddy, MD  cetirizine (ZYRTEC) 10 MG tablet Take 10 mg by mouth at bedtime. 07/16/19   [provider]  FLUoxetine (PROZAC) 40 MG capsule Take 2 capsules (80 mg total) by mouth daily. 02/13/21   Clapacs, Jackquline Denmark, MD  gabapentin (NEURONTIN) 300 MG capsule Take 1 capsule (300 mg total) by mouth at bedtime. Patient not taking: Reported on 10/20/2020 08/18/20 08/18/21  Schermerhorn, Ihor Austin, MD  hydrOXYzine (ATARAX) 50 MG tablet Take 1 tablet (50 mg total) by mouth at bedtime. 02/13/21   Clapacs, Jackquline Denmark, MD  montelukast (SINGULAIR) 10 MG tablet Take 10 mg by mouth at bedtime.    [provider]  nicotine (NICODERM CQ - DOSED IN MG/24 HOURS) 14 mg/24hr patch Place 1 patch (14 mg total) onto the skin daily. 02/14/21   Clapacs, Jackquline Denmark, MD  predniSONE (STERAPRED UNI-PAK 21 TAB) 10 MG (21)  TBPK tablet Take  6 tablet today by mouth then  5 tablets tomorrow then one less tablet every day thereafter. Take with food. 05/25/21   Ratcliffe, Heather R, PA-C  traZODone (DESYREL) 100 MG tablet Take 1 tablet (100 mg total) by mouth at bedtime as needed for sleep. 02/13/21   Clapacs, Jackquline Denmark, MD    Family History History reviewed. No pertinent family history.  Social History Social History   Tobacco Use   Smoking status: Former    Types: Cigarettes    Quit date: 01/17/2020    Years since quitting: 1.7   Smokeless tobacco: Never  Vaping Use   Vaping Use: Every day   Start date: 12/17/2019   Substances: Flavoring  Substance Use Topics   Alcohol  use: Not Currently    Alcohol/week: 2.0 standard drinks of alcohol    Types: 1 Glasses of wine, 1 Cans of beer per week    Comment: rarely   Drug use: Not Currently     Allergies   Other  Review of Systems Review of Systems Pertinent negatives listed in HPI   Physical Exam Triage Vital Signs ED Triage Vitals  Enc Vitals Group     BP 10/18/21 1927 (!) 133/93     Pulse Rate 10/18/21 1927 (!) 101     Resp 10/18/21 1927 18     Temp 10/18/21 1927 98.2 F (36.8 C)     Temp src --      SpO2 10/18/21 1927 98 %     Weight --      Height --      Head Circumference --      Peak Flow --      Pain Score 10/18/21 1916 8     Pain Loc --      Pain Edu? --      Excl. in GC? --    No data found.  Updated Vital Signs BP (!) 133/93   Pulse (!) 101   Temp 98.2 F (36.8 C)   Resp 18   LMP 07/30/2020 (Approximate)   SpO2 98%   Visual Acuity Right Eye Distance:   Left Eye Distance:   Bilateral Distance:    Right Eye Near:   Left Eye Near:    Bilateral Near:     Physical Exam Vitals reviewed.  Constitutional:      Appearance: Normal appearance.  HENT:     Head: Normocephalic and atraumatic.     Ears:     Comments: Left upper auricle small area of erythema below piercing Eyes:     Extraocular Movements: Extraocular movements intact.     Pupils: Pupils are equal, round, and reactive to light.  Cardiovascular:     Rate and Rhythm: Normal rate and regular rhythm.  Pulmonary:     Effort: Pulmonary effort is normal.     Breath sounds: Normal breath sounds.  Skin:    General: Skin is warm and dry.     Capillary Refill: Capillary refill takes less than 2 seconds.  Neurological:     General: No focal deficit present.     Mental Status: She is alert.  Psychiatric:        Mood and Affect: Mood normal.        Behavior: Behavior normal.      UC Treatments / Results  Labs (all labs ordered are listed, but only abnormal results are displayed) Labs Reviewed - No data to  display  EKG   Radiology No results  found.  Procedures Procedures (including critical care time)  Medications Ordered in UC Medications - No data to display  Initial Impression / Assessment and Plan / UC Course  I have reviewed the triage vital signs and the nursing notes.  Pertinent labs & imaging results that were available during my care of the patient were reviewed by me and considered in my medical decision making (see chart for details).    Bilateral TM and inner ear unremarkable. No evidence of infection. Small area of irritation left auricle, recommended OTC neosporin. Follow-up with PCP if symptoms persist. Final Clinical Impressions(s) / UC Diagnoses   Final diagnoses:  Erythema of external ear  Ear fullness, bilateral   Discharge Instructions   None    ED Prescriptions   None    PDMP not reviewed this encounter.   Bing Neighbors, FNP 10/18/21 [phone]

## 2021-10-18 NOTE — ED Triage Notes (Addendum)
Pt here with diminished hearing in both wears and pain x 2 weeks

## 2021-11-23 IMAGING — CT CT HEAD W/O CM
3 series · 16 of 45 positions shown, 19 images · non-contrast
Comparison: None.

CLINICAL DATA: Head trauma, abnormal mental status (Age 19-64y)

EXAM:
CT HEAD WITHOUT CONTRAST
TECHNIQUE: Contiguous axial images were obtained from the base of the skull
through the vertex without intravenous contrast.

[Series 3: head wo · axial · 0.39mm/px · z∈[-128,-13]mm · 10 of 28 slices shown, 13 images]
[im 3/28  brain]
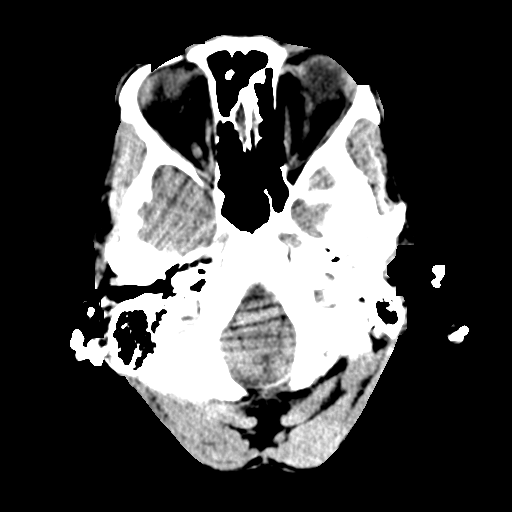
[im 3/28  bone]
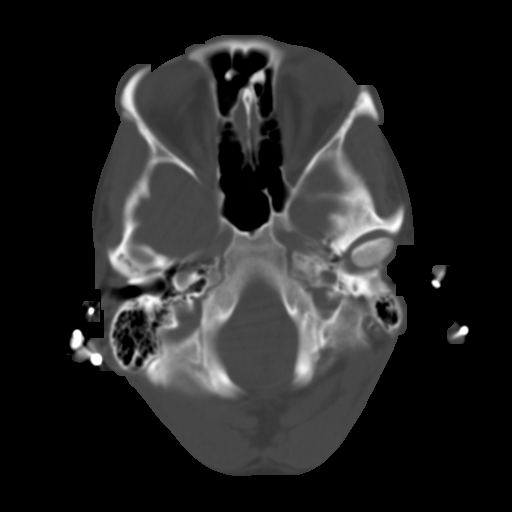
[im 5/28  brain]
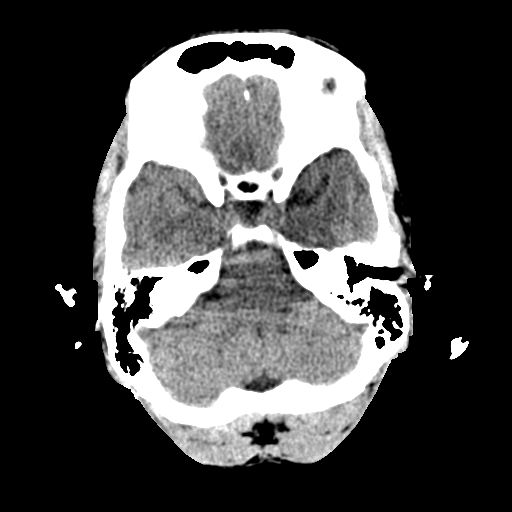
[im 8/28  brain]
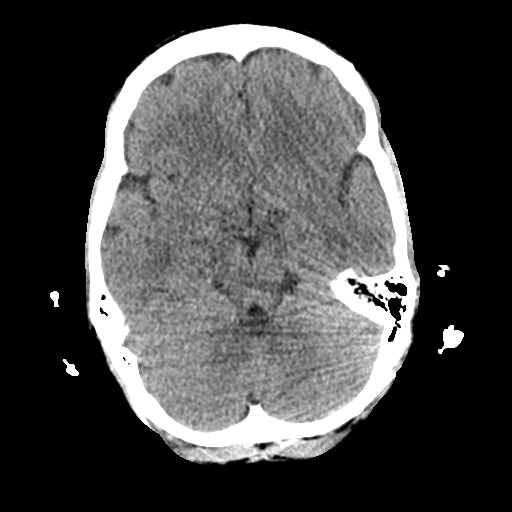
[im 11/28  brain]
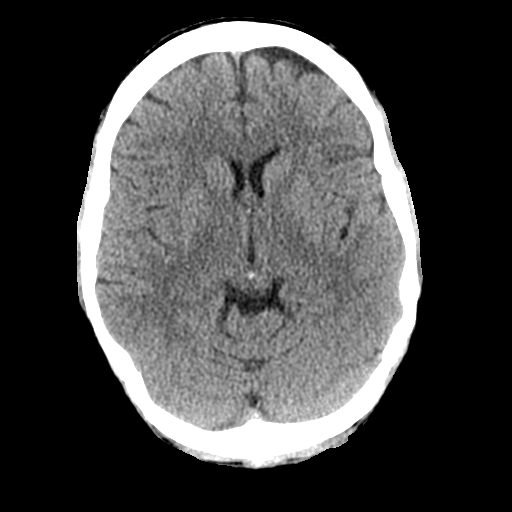
[im 13/28  brain]
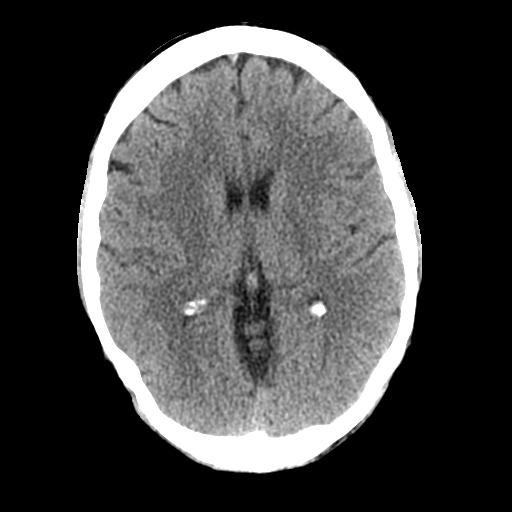
[im 13/28  bone]
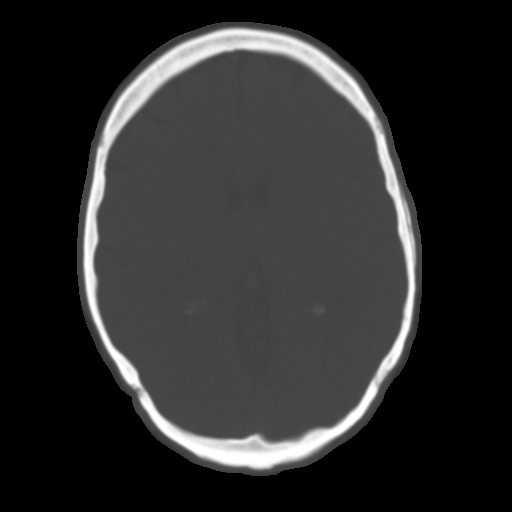
[im 16/28  brain]
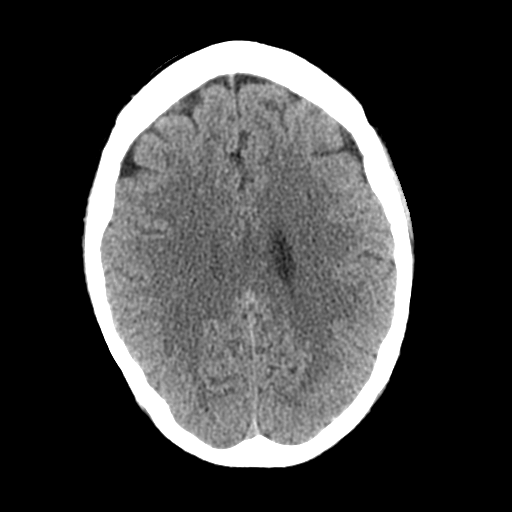
[im 18/28  brain]
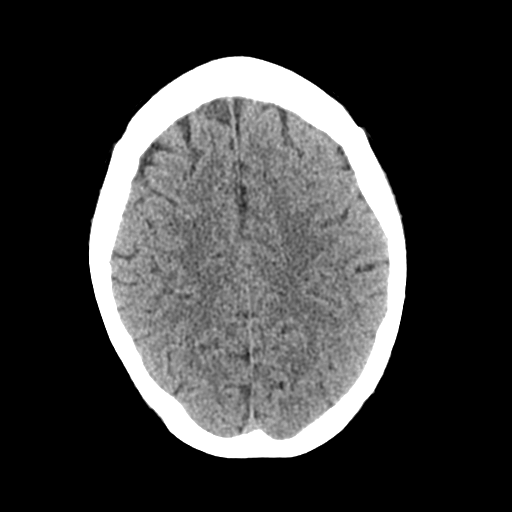
[im 21/28  brain]
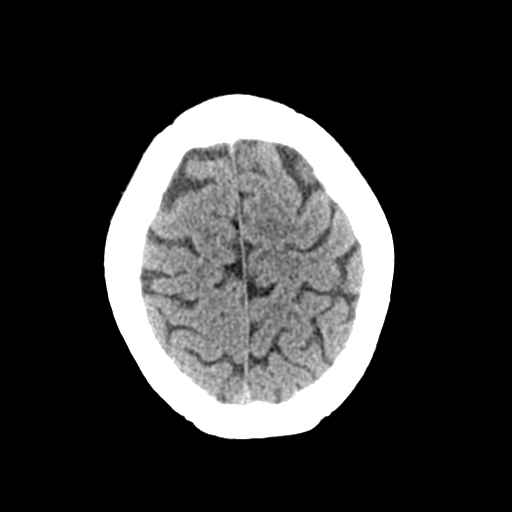
[im 24/28  brain]
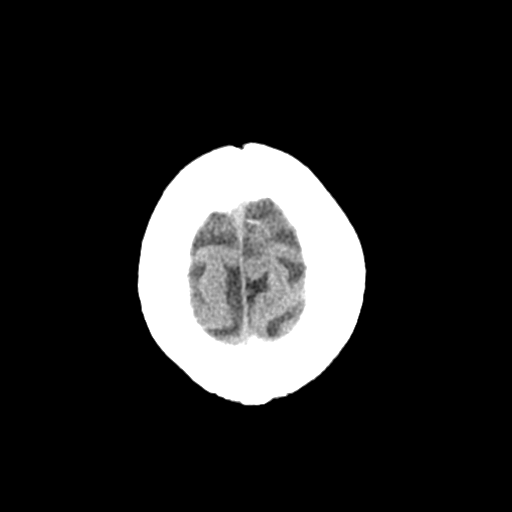
[im 24/28  bone]
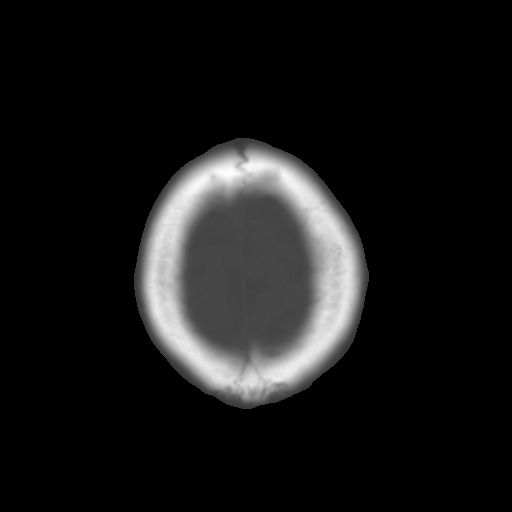
[im 26/28  brain]
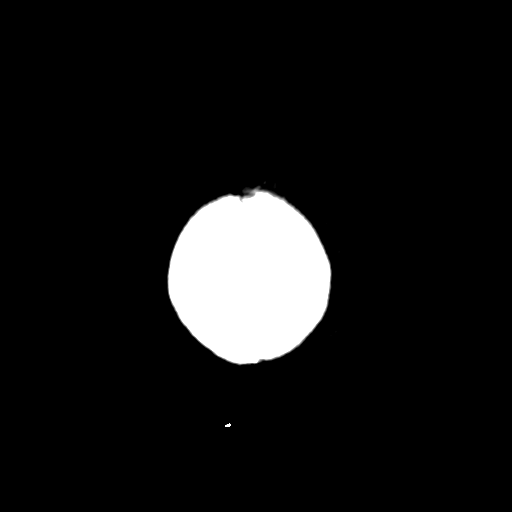

[Series 4: coronal soft tissue · coronal · 0.31mm/px · 3 of 65 slices shown]
[im 22/65  brain]
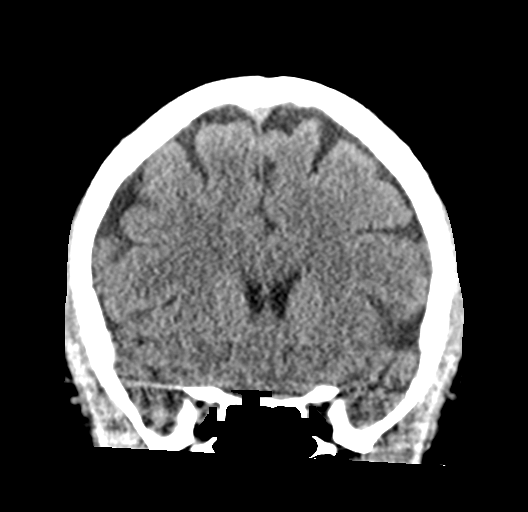
[im 29/65  brain]
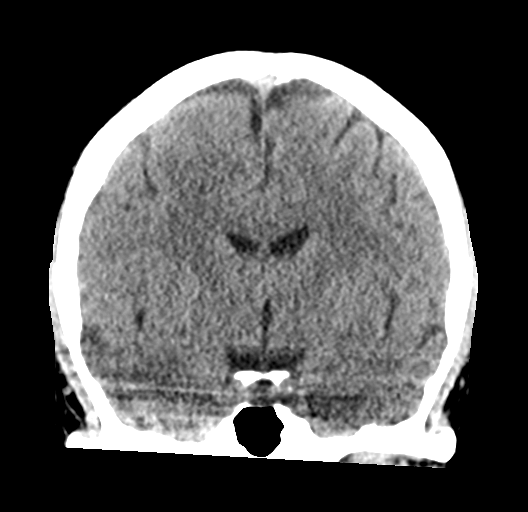
[im 36/65  brain]
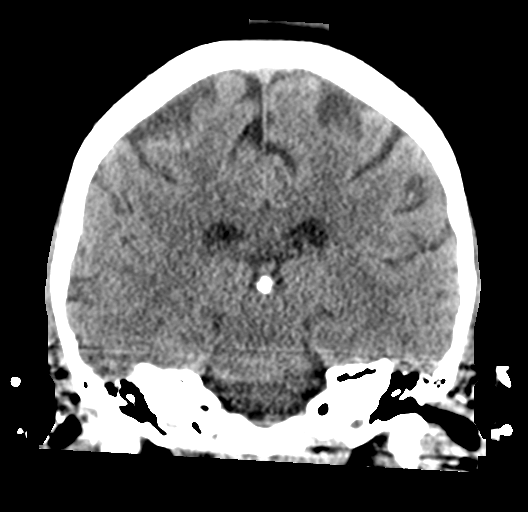

[Series 5: sagittal soft tissue · sagittal · 0.31mm/px · 3 of 53 slices shown]
[im 18/53  brain]
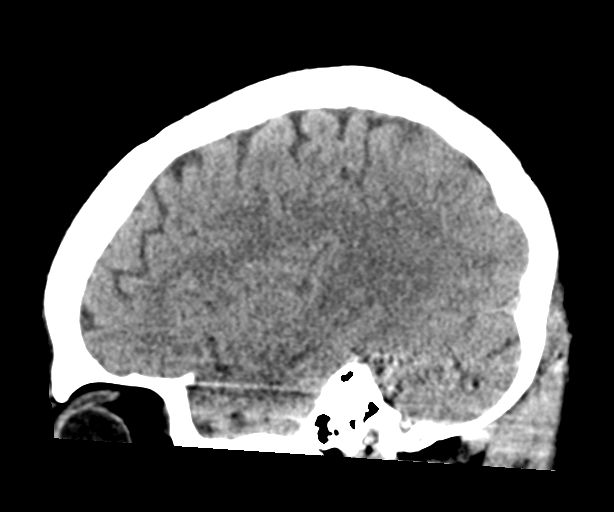
[im 27/53  brain]
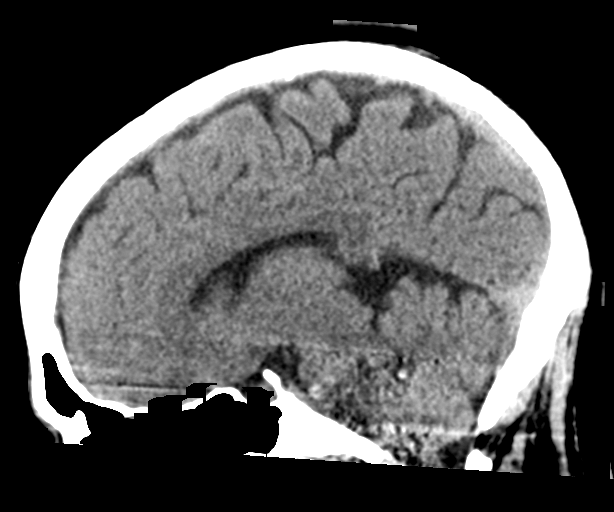
[im 35/53  brain]
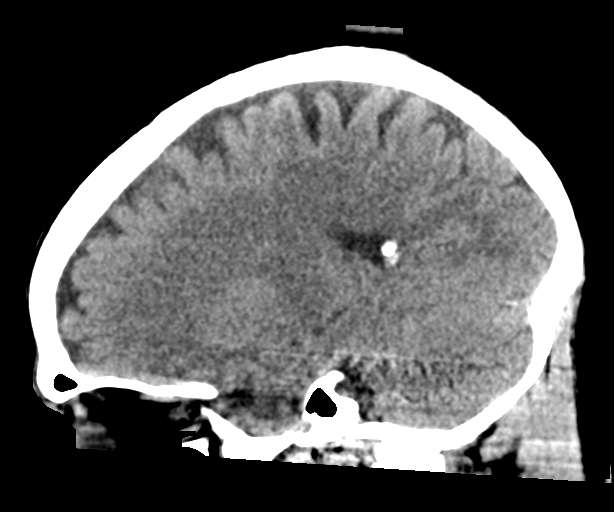

[16 of 45 positions shown; findings below may reference images not displayed]

FINDINGS: Brain: No evidence of acute infarction, hemorrhage, hydrocephalus,
extra-axial collection or mass lesion/mass effect.

Vascular: No hyperdense vessel or unexpected calcification.

Skull: Normal. Negative for fracture or focal lesion.

Sinuses/Orbits: No acute finding.

Other: Negative for scalp hematoma.
IMPRESSION: No acute intracranial findings.

## 2021-12-02 ENCOUNTER — Emergency Department
Admission: EM | Admit: 2021-12-02 | Discharge: 2021-12-03 | Disposition: A | Discharge status: Home / Self Care | Attending: Emergency Medicine | Admitting: Emergency Medicine

## 2021-12-02 ENCOUNTER — Encounter: Payer: Self-pay | Admitting: Emergency Medicine

## 2021-12-02 ENCOUNTER — Other Ambulatory Visit: Payer: Self-pay

## 2021-12-02 DIAGNOSIS — G43909 Migraine, unspecified, not intractable, without status migrainosus: Secondary | ICD-10-CM

## 2021-12-02 DIAGNOSIS — R519 Headache, unspecified: Secondary | ICD-10-CM | POA: Diagnosis present

## 2021-12-02 DIAGNOSIS — Z20822 Contact with and (suspected) exposure to covid-19: Secondary | ICD-10-CM | POA: Insufficient documentation

## 2021-12-02 NOTE — ED Triage Notes (Signed)
Pt presents to ER from home accompanied by husband. Pt reports history of migraines. Pt reports she developed this migraine about 16 hrs ago. Pt reports she used Sumatriptan 5 mg pt reports used 2 sprays with no relief. Pt reports photosensitivity and vomiting.

## 2021-12-03 LAB — RESP PANEL BY RT-PCR (FLU A&B, COVID) ARPGX2
Influenza A by PCR: NEGATIVE
Influenza B by PCR: NEGATIVE
SARS Coronavirus 2 by RT PCR: NEGATIVE

## 2021-12-03 MED ORDER — METOCLOPRAMIDE HCL 5 MG/ML IJ SOLN
10.0000 mg | Freq: Once | INTRAMUSCULAR | Status: AC
  Administered 2021-12-03: 10 mg via INTRAVENOUS
  Filled 2021-12-03: qty 2

## 2021-12-03 MED ORDER — LORAZEPAM 2 MG/ML IJ SOLN
0.5000 mg | Freq: Once | INTRAMUSCULAR | Status: AC
Start: 2021-12-03 — End: 2021-12-03
  Administered 2021-12-03: 0.5 mg via INTRAVENOUS
  Filled 2021-12-03: qty 1

## 2021-12-03 MED ORDER — SODIUM CHLORIDE 0.9 % IV BOLUS
1000.0000 mL | Freq: Once | INTRAVENOUS | Status: AC
  Administered 2021-12-03: 1000 mL via INTRAVENOUS

## 2021-12-03 MED ORDER — KETOROLAC TROMETHAMINE 30 MG/ML IJ SOLN
30.0000 mg | Freq: Once | INTRAMUSCULAR | Status: AC
Start: 2021-12-03 — End: 2021-12-03
  Administered 2021-12-03: 30 mg via INTRAVENOUS
  Filled 2021-12-03: qty 1

## 2021-12-03 NOTE — ED Notes (Signed)
Pt immediately back to sleep after awaking. Waiting on husband to pick pt up.

## 2021-12-03 NOTE — ED Provider Notes (Signed)
[facility] Provider Note    Event Date/Time   First MD Initiated Contact with Patient 12/03/21 0000     (approximate)  History   Chief Complaint: Migraine  HPI  Karen Park is a 41 y.o. female with a past medical history of anxiety, migraines, presents to the emergency department for migraine headache.  According to the patient since this morning she has been experiencing a migraine headache.  Patient states she took her prescribed sumatriptan without relief so she came to the emergency department for evaluation.  Patient states nausea and vomiting which is typical of her migraine headaches.  Denies any fever but states she has had a slight cough recently.  Patient states she has had to come to the hospital multiple times in the past for headaches as well.  Patient does state a diphenhydramine allergy, but she thinks it could be to red dye, she is not sure.  Physical Exam   Triage Vital Signs: ED Triage Vitals [12/02/21 2348]  Enc Vitals Group     BP 138/87     Pulse Rate 94     Resp 16     Temp 98 F (36.7 C)     Temp Source Oral     SpO2 98 %     Weight 215 lb (97.5 kg)     Height 5\' 5"  (1.651 m)     Head Circumference      Peak Flow      Pain Score 7     Pain Loc      Pain Edu?      Excl. in [place]?     Most recent vital signs: Vitals:   12/02/21 2348  BP: 138/87  Pulse: 94  Resp: 16  Temp: 98 F (36.7 C)  SpO2: 98%    General: Awake, lying in bed with a facemask covering her eyes.  States photophobia.  Holding an emesis bag. CV:  Good peripheral perfusion.  Regular rate and rhythm  Resp:  Normal effort.  Equal breath sounds bilaterally.  Abd:  No distention.  Soft, nontender.  No rebound or guarding.   ED Results / Procedures / Treatments   MEDICATIONS ORDERED IN ED: Medications  ketorolac (TORADOL) 30 MG/ML injection 30 mg (has no administration in time range)  metoCLOPramide (REGLAN) injection 10 mg (has no administration  in time range)  sodium chloride 0.9 % bolus 1,000 mL (has no administration in time range)  LORazepam (ATIVAN) injection 0.5 mg (has no administration in time range)     IMPRESSION / MDM / ASSESSMENT AND PLAN / ED COURSE  I reviewed the triage vital signs and the nursing notes.  Patient's presentation is most consistent with acute presentation with potential threat to life or bodily function.  Patient presents emergency department for migraine headache.  Patient has a history of migraines.  States this feels typical of her migraines.  Patient describes moderate to significant global headache with photophobia nausea vomiting.  We will dose Toradol, Reglan, as the patient is allergic to diphenhydramine we will dose a small dose of Ativan, normal saline and continue to closely monitor.  Patient agreeable to plan of care.  COVID test is negative, flu test is negative.  Patient states she is feeling much better after the medications, denies any headache currently.  Patient states she thinks she feels well enough to go home and wants to get some sleep.  We will discharge patient with typical migraine return precautions.  FINAL CLINICAL IMPRESSION(S) / ED DIAGNOSES   Migraine headache   Note:  This document was prepared using Dragon voice recognition software and may include unintentional dictation errors.   Minna Antis, MD 12/03/21 [phone]

## 2022-07-17 ENCOUNTER — Encounter (HOSPITAL_COMMUNITY): Payer: Self-pay

## 2022-08-05 ENCOUNTER — Emergency Department: Payer: 59

## 2022-08-05 ENCOUNTER — Other Ambulatory Visit: Payer: Self-pay

## 2022-08-05 ENCOUNTER — Inpatient Hospital Stay
Admission: EM | Admit: 2022-08-05 | Discharge: 2022-08-06 | DRG: 379 | Disposition: A | Discharge status: Home / Self Care | Payer: 59 | Attending: Internal Medicine | Admitting: Internal Medicine

## 2022-08-05 DIAGNOSIS — K625 Hemorrhage of anus and rectum: Principal | ICD-10-CM

## 2022-08-05 DIAGNOSIS — F419 Anxiety disorder, unspecified: Secondary | ICD-10-CM | POA: Diagnosis present

## 2022-08-05 DIAGNOSIS — Z8349 Family history of other endocrine, nutritional and metabolic diseases: Secondary | ICD-10-CM | POA: Diagnosis not present

## 2022-08-05 DIAGNOSIS — E876 Hypokalemia: Secondary | ICD-10-CM | POA: Diagnosis present

## 2022-08-05 DIAGNOSIS — G4733 Obstructive sleep apnea (adult) (pediatric): Secondary | ICD-10-CM | POA: Diagnosis present

## 2022-08-05 DIAGNOSIS — Z87891 Personal history of nicotine dependence: Secondary | ICD-10-CM | POA: Diagnosis not present

## 2022-08-05 DIAGNOSIS — K921 Melena: Secondary | ICD-10-CM | POA: Diagnosis present

## 2022-08-05 DIAGNOSIS — Z8249 Family history of ischemic heart disease and other diseases of the circulatory system: Secondary | ICD-10-CM

## 2022-08-05 DIAGNOSIS — K922 Gastrointestinal hemorrhage, unspecified: Secondary | ICD-10-CM | POA: Diagnosis present

## 2022-08-05 DIAGNOSIS — F32A Depression, unspecified: Secondary | ICD-10-CM | POA: Diagnosis present

## 2022-08-05 DIAGNOSIS — Z8669 Personal history of other diseases of the nervous system and sense organs: Secondary | ICD-10-CM | POA: Diagnosis not present

## 2022-08-05 DIAGNOSIS — Z808 Family history of malignant neoplasm of other organs or systems: Secondary | ICD-10-CM

## 2022-08-05 DIAGNOSIS — E162 Hypoglycemia, unspecified: Secondary | ICD-10-CM | POA: Diagnosis present

## 2022-08-05 DIAGNOSIS — Z79899 Other long term (current) drug therapy: Secondary | ICD-10-CM | POA: Diagnosis not present

## 2022-08-05 DIAGNOSIS — Z9049 Acquired absence of other specified parts of digestive tract: Secondary | ICD-10-CM

## 2022-08-05 DIAGNOSIS — K219 Gastro-esophageal reflux disease without esophagitis: Secondary | ICD-10-CM | POA: Diagnosis present

## 2022-08-05 DIAGNOSIS — K2960 Other gastritis without bleeding: Secondary | ICD-10-CM | POA: Diagnosis present

## 2022-08-05 DIAGNOSIS — Z800 Family history of malignant neoplasm of digestive organs: Secondary | ICD-10-CM

## 2022-08-05 DIAGNOSIS — Z818 Family history of other mental and behavioral disorders: Secondary | ICD-10-CM | POA: Diagnosis not present

## 2022-08-05 DIAGNOSIS — M797 Fibromyalgia: Secondary | ICD-10-CM | POA: Diagnosis present

## 2022-08-05 DIAGNOSIS — Z87442 Personal history of urinary calculi: Secondary | ICD-10-CM | POA: Diagnosis not present

## 2022-08-05 LAB — PROTIME-INR
INR: 1 (ref 0.8–1.2)
Prothrombin Time: 13.6 seconds (ref 11.4–15.2)

## 2022-08-05 LAB — COMPREHENSIVE METABOLIC PANEL
ALT: 19 U/L (ref 0–44)
AST: 19 U/L (ref 15–41)
Albumin: 4.2 g/dL (ref 3.5–5.0)
Alkaline Phosphatase: 57 U/L (ref 38–126)
Anion gap: 12 (ref 5–15)
BUN: 14 mg/dL (ref 6–20)
CO2: 19 mmol/L — ABNORMAL LOW (ref 22–32)
Calcium: 8.8 mg/dL — ABNORMAL LOW (ref 8.9–10.3)
Chloride: 102 mmol/L (ref 98–111)
Creatinine, Ser: 0.9 mg/dL (ref 0.44–1.00)
GFR, Estimated: >60 mL/min (ref >60)
Glucose, Bld: 66 mg/dL — ABNORMAL LOW (ref 70–99)
Potassium: 3.3 mmol/L — ABNORMAL LOW (ref 3.5–5.1)
Sodium: 133 mmol/L — ABNORMAL LOW (ref 135–145)
Total Bilirubin: 0.5 mg/dL (ref 0.3–1.2)
Total Protein: 7.7 g/dL (ref 6.5–8.1)

## 2022-08-05 LAB — D-DIMER, QUANTITATIVE: D-Dimer, Quant: 0.33 ug/mL-FEU (ref 0.00–0.50)

## 2022-08-05 LAB — CBC
HCT: 37.3 % (ref 36.0–46.0)
Hemoglobin: 12 g/dL (ref 12.0–15.0)
MCH: 28.1 pg (ref 26.0–34.0)
MCHC: 32.2 g/dL (ref 30.0–36.0)
MCV: 87.4 fL (ref 80.0–100.0)
Platelets: 409 10*3/uL — ABNORMAL HIGH (ref 150–400)
RBC: 4.27 MIL/uL (ref 3.87–5.11)
RDW: 14.9 % (ref 11.5–15.5)
WBC: 9.4 10*3/uL (ref 4.0–10.5)
nRBC: 0 % (ref 0.0–0.2)

## 2022-08-05 LAB — HEMOGLOBIN AND HEMATOCRIT, BLOOD
HCT: 34.2 % — ABNORMAL LOW (ref 36.0–46.0)
Hemoglobin: 11.1 g/dL — ABNORMAL LOW (ref 12.0–15.0)

## 2022-08-05 LAB — TYPE AND SCREEN
ABO/RH(D): A POS
Antibody Screen: NEGATIVE

## 2022-08-05 LAB — HEMOGLOBIN
Hemoglobin: 10.9 g/dL — ABNORMAL LOW (ref 12.0–15.0)
Hemoglobin: 10.9 g/dL — ABNORMAL LOW (ref 12.0–15.0)
Hemoglobin: 10.8 g/dL — ABNORMAL LOW (ref 12.0–15.0)

## 2022-08-05 LAB — CBG MONITORING, ED: Glucose-Capillary: 87 mg/dL (ref 70–99)

## 2022-08-05 LAB — HIV ANTIBODY (ROUTINE TESTING W REFLEX): HIV Screen 4th Generation wRfx: NONREACTIVE

## 2022-08-05 MED ORDER — ACETAMINOPHEN 325 MG PO TABS
650.0000 mg | ORAL_TABLET | Freq: Four times a day (QID) | ORAL | Status: DC | PRN
  Administered 2022-08-05: 650 mg via ORAL
  Filled 2022-08-05: qty 2

## 2022-08-05 MED ORDER — SODIUM CHLORIDE 0.9 % IV BOLUS
500.0000 mL | Freq: Once | INTRAVENOUS | Status: AC
  Administered 2022-08-05: 500 mL via INTRAVENOUS

## 2022-08-05 MED ORDER — SODIUM CHLORIDE 0.9 % IV SOLN: INTRAVENOUS | Status: DC

## 2022-08-05 MED ORDER — IOHEXOL 300 MG/ML  SOLN
100.0000 mL | Freq: Once | INTRAMUSCULAR | Status: AC | PRN
  Administered 2022-08-05: 100 mL via INTRAVENOUS

## 2022-08-05 MED ORDER — ACETAMINOPHEN 650 MG RE SUPP: 650.0000 mg | Freq: Four times a day (QID) | RECTAL | Status: DC | PRN

## 2022-08-05 MED ORDER — ONDANSETRON HCL 4 MG/2ML IJ SOLN: 4.0000 mg | Freq: Four times a day (QID) | INTRAMUSCULAR | Status: DC | PRN

## 2022-08-05 MED ORDER — PANTOPRAZOLE SODIUM 40 MG IV SOLR
40.0000 mg | Freq: Two times a day (BID) | INTRAVENOUS | Status: DC
  Administered 2022-08-05 – 2022-08-06 (×3): 40 mg via INTRAVENOUS
  Filled 2022-08-05 (×3): qty 10

## 2022-08-05 MED ORDER — ACETAMINOPHEN 500 MG PO TABS
1000.0000 mg | ORAL_TABLET | Freq: Once | ORAL | Status: AC
  Administered 2022-08-05: 1000 mg via ORAL
  Filled 2022-08-05: qty 2

## 2022-08-05 MED ORDER — METOCLOPRAMIDE HCL 5 MG/ML IJ SOLN
5.0000 mg | Freq: Once | INTRAMUSCULAR | Status: AC
  Administered 2022-08-05: 5 mg via INTRAVENOUS
  Filled 2022-08-05: qty 2

## 2022-08-05 MED ORDER — PANTOPRAZOLE SODIUM 40 MG IV SOLR: 40.0000 mg | Freq: Once | INTRAVENOUS | Status: DC

## 2022-08-05 MED ORDER — ONDANSETRON HCL 4 MG PO TABS
4.0000 mg | ORAL_TABLET | Freq: Four times a day (QID) | ORAL | Status: DC | PRN
  Administered 2022-08-05: 4 mg via ORAL
  Filled 2022-08-05: qty 1

## 2022-08-05 MED ORDER — PANTOPRAZOLE SODIUM 40 MG IV SOLR
40.0000 mg | Freq: Once | INTRAVENOUS | Status: AC
Start: 2022-08-05 — End: 2022-08-05
  Administered 2022-08-05: 40 mg via INTRAVENOUS
  Filled 2022-08-05: qty 10

## 2022-08-05 MED ORDER — ALBUTEROL SULFATE (2.5 MG/3ML) 0.083% IN NEBU: 2.5000 mg | INHALATION_SOLUTION | RESPIRATORY_TRACT | Status: DC | PRN

## 2022-08-05 NOTE — ED Notes (Signed)
Pt took 1 of her personal glucose tablets, was provided with 3 packs of saltines as well.

## 2022-08-05 NOTE — ED Notes (Signed)
Blue and red top sent down as well.

## 2022-08-05 NOTE — Consult Note (Signed)
Midge Minium, MD [facility]  [address] [place], [place] [address] Phone: [phone] Fax : [phone]  Consultation  Referring Provider:     Dr. Arnoldo Morale Primary Care Physician:  Carren Rang, PA-C Primary Gastroenterologist: Gentry Fitz         Reason for Consultation:     GI bleeding  Date of Admission:  08/05/2022 Date of Consultation:  08/05/2022     HPI:   Karen Park is a 42 y.o. female who was admitted with a history of black tarry stools and coughing up blood.  The patient had a hemoglobin on admission of 12 which was at 2 AM and a repeat at 5 AM showed a hemoglobin of 11.1 without much hydration to explain this drop. The patient has a history of of anxiety, migraines and has been to the emergency department with migraine headaches in the past.  The patient has a history of bariatric surgery with a gastric bypass. The patient had reported that prior to admission she had noted black tarry stools and upper abdominal pain.  She also reported shortness of breath which she described to the emergency department provider as feeling like she cannot take a deep breath.  She was found to be hypoglycemic although she did report having dinner the night before admission.  She also denies any significant alcohol use.  The patient was started on pantoprazole and a CT scan showed no acute findings to explain the patient's symptoms. The patient denies ever having symptoms like this in the past or a history of GI bleeding.  She reports that her EGD and colonoscopy in the past with prior to having her gastric bypass surgery.  She also reports that she coughed up the blood and denies any vomiting.  She still continues to have abdominal pain and the pain is in the lower abdomen.  Past Medical History:  Diagnosis Date   Anxiety    Arthritis    both knees, fingers, spine   Depression    History of hiatal hernia    repair 2020   History of kidney stones    Migraine    Pneumonia      Past Surgical History:  Procedure Laterality Date   ABDOMINAL HYSTERECTOMY     BARIATRIC SURGERY  01/02/2018   CESAREAN SECTION     CHOLECYSTECTOMY     COLONOSCOPY     dental work     ESOPHAGOGASTRODUODENOSCOPY     exploratory laparoscopic     GASTRIC BYPASS     HERNIA REPAIR     x2   HYSTERECTOMY ABDOMINAL WITH SALPINGECTOMY Bilateral 08/16/2020   Procedure: HYSTERECTOMY ABDOMINAL WITH BILATERAL SALPINGECTOMY  (L) OOPHORECTOMY;  Surgeon: Suzy Bouchard, MD;  Location: [facility] ORS;  Service: Gynecology;  Laterality: Bilateral;   OOPHORECTOMY Left 08/16/2020   Procedure: OOPHORECTOMY;  Surgeon: Schermerhorn, Ihor Austin, MD;  Location: [facility] ORS;  Service: Gynecology;  Laterality: Left;    Prior to Admission medications   Medication Sig Start Date End Date Taking? Authorizing Provider  amoxicillin-clavulanate (AUGMENTIN) 875-125 MG tablet Take 1 tablet by mouth 2 (two) times daily. Take with food 04/06/21   Ratcliffe, Heather R, PA-C  buPROPion (WELLBUTRIN XL) 150 MG 24 hr tablet Take 1 tablet (150 mg total) by mouth daily. 02/13/21   Clapacs, Jackquline Denmark, MD  busPIRone (BUSPAR) 7.5 MG tablet Take 1 tablet (7.5 mg total) by mouth 2 (two) times daily. 02/13/21   Clapacs, Jackquline Denmark, MD  cetirizine (  ZYRTEC) 10 MG tablet Take 10 mg by mouth at bedtime. 07/16/19   [provider]  FLUoxetine (PROZAC) 40 MG capsule Take 2 capsules (80 mg total) by mouth daily. 02/13/21   Clapacs, Jackquline Denmark, MD  gabapentin (NEURONTIN) 300 MG capsule Take 1 capsule (300 mg total) by mouth at bedtime. Patient not taking: Reported on 10/20/2020 08/18/20 08/18/21  Schermerhorn, Ihor Austin, MD  hydrOXYzine (ATARAX) 50 MG tablet Take 1 tablet (50 mg total) by mouth at bedtime. 02/13/21   Clapacs, Jackquline Denmark, MD  montelukast (SINGULAIR) 10 MG tablet Take 10 mg by mouth at bedtime.    [provider]  nicotine (NICODERM CQ - DOSED IN MG/24 HOURS) 14 mg/24hr patch Place 1 patch (14 mg total) onto the skin daily. 02/14/21    Clapacs, Jackquline Denmark, MD  predniSONE (STERAPRED UNI-PAK 21 TAB) 10 MG (21) TBPK tablet Take  6 tablet today by mouth then  5 tablets tomorrow then one less tablet every day thereafter. Take with food. 05/25/21   Ratcliffe, Heather R, PA-C  traZODone (DESYREL) 100 MG tablet Take 1 tablet (100 mg total) by mouth at bedtime as needed for sleep. 02/13/21   Clapacs, Jackquline Denmark, MD    History reviewed. No pertinent family history.   Social History   Tobacco Use   Smoking status: Former    Types: Cigarettes    Quit date: 01/17/2020    Years since quitting: 2.5   Smokeless tobacco: Never  Vaping Use   Vaping Use: Every day   Start date: 12/17/2019   Substances: Flavoring  Substance Use Topics   Alcohol use: Not Currently    Alcohol/week: 2.0 standard drinks of alcohol    Types: 1 Glasses of wine, 1 Cans of beer per week    Comment: rarely   Drug use: Not Currently    Allergies as of 08/05/2022 - Review Complete 08/05/2022  Allergen Reaction Noted   Other Swelling 08/16/2020    Review of Systems:    All systems reviewed and negative except where noted in HPI.   Physical Exam:  Vital signs in last 24 hours: Temp:  [98.7 F (37.1 C)] 98.7 F (37.1 C) (05/25 0211) Pulse Rate:  [85-109] 99 (05/25 0600) Resp:  [14-29] 28 (05/25 0600) BP: (97-131)/(59-104) 116/71 (05/25 0600) SpO2:  [96 %-100 %] 96 % (05/25 0600) Weight:  [102.1 kg] 102.1 kg (05/25 0211)   General:   Pleasant, cooperative in NAD Head:  Normocephalic and atraumatic. Eyes:   No icterus.   Conjunctiva pink. PERRLA. Ears:  Normal auditory acuity. Neck:  Supple; no masses or thyroidomegaly Lungs: Respirations even and unlabored. Lungs clear to auscultation bilaterally.   No wheezes, crackles, or rhonchi.  Heart:  Regular rate and rhythm;  Without murmur, clicks, rubs or gallops Abdomen:  Soft, nondistended, nontender. Normal bowel sounds. No appreciable masses or hepatomegaly.  No rebound or guarding.  Rectal:  Not  performed. Msk:  Symmetrical without gross deformities.    Extremities:  Without edema, cyanosis or clubbing. Neurologic:  Alert and oriented x3;  grossly normal neurologically. Skin:  Intact without significant lesions or rashes. Cervical Nodes:  No significant cervical adenopathy. Psych:  Alert and cooperative. Normal affect.  LAB RESULTS: Recent Labs    08/05/22 0217 08/05/22 0521  WBC 9.4  --   HGB 12.0 11.1*  HCT 37.3 34.2*  PLT 409*  --    BMET Recent Labs    08/05/22 0217  NA 133*  K 3.3*  CL  102  CO2 19*  GLUCOSE 66*  BUN 14  CREATININE 0.90  CALCIUM 8.8*   LFT Recent Labs    08/05/22 0217  PROT 7.7  ALBUMIN 4.2  AST 19  ALT 19  ALKPHOS 57  BILITOT 0.5   PT/INR No results for input(s): "LABPROT", "INR" in the last 72 hours.  STUDIES: CT ABDOMEN PELVIS W CONTRAST  Result Date: 08/05/2022 CLINICAL DATA:  Left lower quadrant abdominal pain. Black tarry stools. Coughing up blood. EXAM: CT ABDOMEN AND PELVIS WITH CONTRAST TECHNIQUE: Multidetector CT imaging of the abdomen and pelvis was performed using the standard protocol following bolus administration of intravenous contrast. RADIATION DOSE REDUCTION: This exam was performed according to the departmental dose-optimization program which includes automated exposure control, adjustment of the mA and/or kV according to patient size and/or use of iterative reconstruction technique. CONTRAST:  OMNIPAQUE IOHEXOL 300 MG/ML  SOLN COMPARISON:  12/16/2019 FINDINGS: Lower chest:  No contributory findings. Hepatobiliary: No focal liver abnormality.Cholecystectomy. No biliary dilatation Pancreas: Unremarkable. Spleen: Unremarkable. Adrenals/Urinary Tract: Negative adrenals. No hydronephrosis or stone. Unremarkable bladder. Stomach/Bowel: Gastric bypass without complicating features. No bowel obstruction or visible inflammation. Vascular/Lymphatic: No acute vascular abnormality. No mass or adenopathy.  Reproductive:Hysterectomy. Other: No ascites or pneumoperitoneum. Ventral hernia repair with mesh. Musculoskeletal: No acute abnormalities. IMPRESSION: No acute finding or explanation for symptoms. Electronically Signed   By: Tiburcio Pea M.D.   On: 08/05/2022 04:33      Impression / Plan:   Assessment: Principal Problem:   UGIB (upper gastrointestinal bleed)   Karen Park is a 42 y.o. y/o female with with some lower abdominal pain with a report of coughing up blood and black stools.  The patient did drop her hemoglobin from the time she was admitted to 4 hours after being admitted.  She was kept n.p.o. and a GI consult was called.  Plan:  The patient has been told that with her drop in hemoglobin and her history of gastric bypass with her melena she should be evaluated for a marginal/anastomotic ulcer.  The patient will be set up for an EGD for tomorrow.  PPI IV twice daily  Continue serial CBCs and transfuse PRN Avoid NSAIDs Maintain 2 large-bore IV lines Please page GI with any acute hemodynamic changes, or signs of active GI bleeding   Thank you for involving me in the care of this patient.      LOS: 0 days   Midge Minium, MD, [facility] 08/05/2022, 7:20 AM,  Pager [phone] 7am-5pm  Check AMION for 5pm -7am coverage and on weekends   Note: This dictation was prepared with Dragon dictation along with smaller phrase technology. Any transcriptional errors that result from this process are unintentional.

## 2022-08-05 NOTE — ED Triage Notes (Signed)
Pt to ED from home for black tary stools and coughing up blood. Has occurred twice today. Pt is CAOx4, in no acute distress and ambulatory in triage.

## 2022-08-05 NOTE — ED Provider Notes (Signed)
[facility] Provider Note    Event Date/Time   First MD Initiated Contact with Patient 08/05/22 [phone]     (approximate)   History   Rectal Bleeding   HPI  Karen Park is a 42 y.o. female past medical history significant for anxiety, migraine headaches, gastric bypass, presents to the emergency department with concern for rectal bleed.  States that earlier today she noted black dark tarry stools.  Had an episode just prior to arrival where she had upper abdominal pain -but like she coughed up blood.  Ongoing shortness of breath which she states she just feels like she cannot take a deep breath.  Not on anticoagulation.  Glucose was 66 on arrival and patient states that this sometimes happens secondary to her gastric bypass.  Ate dinner tonight.  No prior history of a significant upper GI bleed.  Denies any significant alcohol use.     Physical Exam   Triage Vital Signs: ED Triage Vitals [08/05/22 0211]  Enc Vitals Group     BP (!) 97/59     Pulse Rate 100     Resp 16     Temp 98.7 F (37.1 C)     Temp Source Oral     SpO2 99 %     Weight 225 lb (102.1 kg)     Height 5\' 5"  (1.651 m)     Head Circumference      Peak Flow      Pain Score      Pain Loc      Pain Edu?      Excl. in [place]?     Most recent vital signs: Vitals:   08/05/22 0500 08/05/22 0600  BP: 119/63 116/71  Pulse: 100 99  Resp: 20 (!) 28  Temp:    SpO2: 98% 96%    Physical Exam Constitutional:      Appearance: She is well-developed.  HENT:     Head: Atraumatic.  Eyes:     Conjunctiva/sclera: Conjunctivae normal.  Cardiovascular:     Rate and Rhythm: Regular rhythm. Tachycardia present.     Comments: Tachycardic up to 120s with talking or with sitting up Pulmonary:     Effort: No respiratory distress.  Abdominal:     General: There is no distension.     Tenderness: There is abdominal tenderness.  Genitourinary:    Comments: No gross blood Musculoskeletal:         General: Normal range of motion.     Cervical back: Normal range of motion.  Skin:    General: Skin is warm.     Capillary Refill: Capillary refill takes less than 2 seconds.  Neurological:     Mental Status: She is alert. Mental status is at baseline.     IMPRESSION / MDM / ASSESSMENT AND PLAN / ED COURSE  I reviewed the triage vital signs and the nursing notes.  Differential diagnosis including upper GI bleed, lower GI bleed, gastritis/PUD, pancreatitis, pulmonary embolism.  Patient low risk Wells criteria so we will add on a D-dimer.    EKG  I, Corena Herter, the attending physician, personally viewed and interpreted this ECG.   Rate: Normal  Rhythm: Normal sinus  Axis: Normal  Intervals: Normal  ST&T Change: None  Tachycardic up to the 120s while on cardiac telemetry.  RADIOLOGY I independently reviewed imaging, my interpretation of imaging: CT scan without acute findings.  Read as no acute findings.  LABS (all labs ordered  are listed, but only abnormal results are displayed) Labs interpreted as -    Labs Reviewed  COMPREHENSIVE METABOLIC PANEL - Abnormal; Notable for the following components:      Result Value   Sodium 133 (*)    Potassium 3.3 (*)    CO2 19 (*)    Glucose, Bld 66 (*)    Calcium 8.8 (*)    All other components within normal limits  CBC - Abnormal; Notable for the following components:   Platelets 409 (*)    All other components within normal limits  HEMOGLOBIN AND HEMATOCRIT, BLOOD - Abnormal; Notable for the following components:   Hemoglobin 11.1 (*)    HCT 34.2 (*)    All other components within normal limits  D-DIMER, QUANTITATIVE  POC OCCULT BLOOD, ED  CBG MONITORING, ED  TYPE AND SCREEN     MDM    Patient found to be hypoglycemic with a glucose in the 66.  Given p.o.  Glucose improved to 87.  CO2 of 19.  Hemoglobin level stable at 12.  Low risk Wells criteria, D-dimer is negative, no further suspicion for pulmonary embolism,  concerned that her tachycardia and coughing up blood was actually hematemesis.  Repeat hemoglobin level at 11.1 with a drop from 12 just a couple of hours prior to that.  Given her tachycardia and concern for melena and an upper GI bleed consulted and discussed the patient's case with gastroenterology Dr. Servando Snare, recommended admission to the hospitalist for further monitoring.  Patient was given IV Protonix.   PROCEDURES:  Critical Care performed: No  Procedures  Patient's presentation is most consistent with acute presentation with potential threat to life or bodily function.   MEDICATIONS ORDERED IN ED: Medications  pantoprazole (PROTONIX) injection 40 mg (40 mg Intravenous Given 08/05/22 0350)  sodium chloride 0.9 % bolus 500 mL (0 mLs Intravenous Stopped 08/05/22 0510)  iohexol (OMNIPAQUE) 300 MG/ML solution 100 mL (100 mLs Intravenous Contrast Given 08/05/22 0412)  acetaminophen (TYLENOL) tablet 1,000 mg (1,000 mg Oral Given 08/05/22 0647)  metoCLOPramide (REGLAN) injection 5 mg (5 mg Intravenous Given 08/05/22 0648)    FINAL CLINICAL IMPRESSION(S) / ED DIAGNOSES   Final diagnoses:  Rectal bleeding     Rx / DC Orders   ED Discharge Orders     None        Note:  This document was prepared using Dragon voice recognition software and may include unintentional dictation errors.   Corena Herter, MD 08/05/22 [phone]

## 2022-08-05 NOTE — ED Notes (Signed)
Zach RN aware of assigned bed 

## 2022-08-05 NOTE — H&P (Signed)
History and Physical    Karen Park DOB: 04-10-1980 DOA: 08/05/2022  PCP: Carren Rang, PA-C  Patient coming from: home  I have personally briefly reviewed patient's old medical records in [facility]  Chief Complaint: black tarry stools and coughing up blood  HPI: Karen Park is a 42 y.o. female with medical history significant of  Anxiety, depression, migraine, history of hiatal hernia s/p repair,fibromyalgia, OSA on CPAP, abnormal uterine bleeding, recurrent epistaxis, GERD,OCD, who  presents to ED with complaint of black tarry stools associated with upper  and lower abdominal pain, as well as shortness of breath. Patient denies any prior history of blood in stools. Patient also denies use of nsaids or blood thinners.  She notes no history of hereditary bleeding disorders. She states she began to feel unwell around 3 days ago with increase nausea as well as gerd symptoms. She did not think much of it as it was intermittent. However last pm she has 2 episodes of black tarry stools and once episode of what she describes as coughing up blood. She notes since last pm she has had no recurrence of these symptoms.  ON ros she denies fever/chills/dysuria/nose bleeds / chest pain/ uri sxs.   ED Course:  Afeb, bp 97/59, hr 100 rr 16 sat 99%  Wbc: 9.4, hgb12 repeat 11,  mcv 87 plt 409 NA 133, K 3.3 bicarb19, glu 66,  cr 0.9 CTAb/pelvis: IMPRESSION: No acute finding or explanation for symptoms.   Gi consulted Dr Servando Snare  Review of Systems: As per HPI otherwise 10 point review of systems negative.   Past Medical History:  Diagnosis Date   Anxiety    Arthritis    both knees, fingers, spine   Depression    History of hiatal hernia    repair 2020   History of kidney stones    Migraine    Pneumonia     Past Surgical History:  Procedure Laterality Date   ABDOMINAL HYSTERECTOMY     BARIATRIC SURGERY  01/02/2018   CESAREAN SECTION     CHOLECYSTECTOMY      COLONOSCOPY     dental work     ESOPHAGOGASTRODUODENOSCOPY     exploratory laparoscopic     GASTRIC BYPASS     HERNIA REPAIR     x2   HYSTERECTOMY ABDOMINAL WITH SALPINGECTOMY Bilateral 08/16/2020   Procedure: HYSTERECTOMY ABDOMINAL WITH BILATERAL SALPINGECTOMY  (L) OOPHORECTOMY;  Surgeon: Suzy Bouchard, MD;  Location: [facility] ORS;  Service: Gynecology;  Laterality: Bilateral;   OOPHORECTOMY Left 08/16/2020   Procedure: OOPHORECTOMY;  Surgeon: Schermerhorn, Ihor Austin, MD;  Location: [facility] ORS;  Service: Gynecology;  Laterality: Left;     reports that she quit smoking about 2 years ago. Her smoking use included cigarettes. She has never used smokeless tobacco. She reports that she does not currently use alcohol after a past usage of about 2.0 standard drinks of alcohol per week. She reports that she does not currently use drugs.  Allergies  Allergen Reactions   Other Swelling    Blue cheese.    FH Mental illness Mother  Alcohol abuse Mother  High blood pressure (Hypertension) Mother  Hyperlipidemia (Elevated cholesterol) Father  High blood pressure (Hypertension) Father  Insomnia Sister  Depression Sister  Post-traumatic stress disorder Brother  Suicidality Brother  ADD / ADHD Son  ODD Son  OCD Son  Cancer Maternal Grandmother  Stomach, brain  Alzheimer's disease Maternal Grandfather  No Known Problems Paternal Grandmother  Colon cancer Paternal Grandfather 45  Prior to Admission medications   Medication Sig Start Date End Date Taking? Authorizing Provider  amoxicillin-clavulanate (AUGMENTIN) 875-125 MG tablet Take 1 tablet by mouth 2 (two) times daily. Take with food 04/06/21   Ratcliffe, Heather R, PA-C  buPROPion (WELLBUTRIN XL) 150 MG 24 hr tablet Take 1 tablet (150 mg total) by mouth daily. 02/13/21   Clapacs, Jackquline Denmark, MD  busPIRone (BUSPAR) 7.5 MG tablet Take 1 tablet (7.5 mg total) by mouth 2 (two) times daily. 02/13/21   Clapacs, Jackquline Denmark, MD  cetirizine (ZYRTEC) 10  MG tablet Take 10 mg by mouth at bedtime. 07/16/19   [provider]  FLUoxetine (PROZAC) 40 MG capsule Take 2 capsules (80 mg total) by mouth daily. 02/13/21   Clapacs, Jackquline Denmark, MD  gabapentin (NEURONTIN) 300 MG capsule Take 1 capsule (300 mg total) by mouth at bedtime. Patient not taking: Reported on 10/20/2020 08/18/20 08/18/21  Schermerhorn, Ihor Austin, MD  hydrOXYzine (ATARAX) 50 MG tablet Take 1 tablet (50 mg total) by mouth at bedtime. 02/13/21   Clapacs, Jackquline Denmark, MD  montelukast (SINGULAIR) 10 MG tablet Take 10 mg by mouth at bedtime.    [provider]  nicotine (NICODERM CQ - DOSED IN MG/24 HOURS) 14 mg/24hr patch Place 1 patch (14 mg total) onto the skin daily. 02/14/21   Clapacs, Jackquline Denmark, MD  predniSONE (STERAPRED UNI-PAK 21 TAB) 10 MG (21) TBPK tablet Take  6 tablet today by mouth then  5 tablets tomorrow then one less tablet every day thereafter. Take with food. 05/25/21   Ratcliffe, Heather R, PA-C  traZODone (DESYREL) 100 MG tablet Take 1 tablet (100 mg total) by mouth at bedtime as needed for sleep. 02/13/21   Clapacs, Jackquline Denmark, MD    Physical Exam: Vitals:   08/05/22 0400 08/05/22 0430 08/05/22 0500 08/05/22 0600  BP: (!) 131/104 (!) 117/59 119/63 116/71  Pulse: (!) 109 85 100 99  Resp: (!) 29 (!) 27 20 (!) 28  Temp:      TempSrc:      SpO2: 100% 97% 98% 96%  Weight:      Height:        Constitutional: NAD, calm, comfortable Vitals:   08/05/22 0400 08/05/22 0430 08/05/22 0500 08/05/22 0600  BP: (!) 131/104 (!) 117/59 119/63 116/71  Pulse: (!) 109 85 100 99  Resp: (!) 29 (!) 27 20 (!) 28  Temp:      TempSrc:      SpO2: 100% 97% 98% 96%  Weight:      Height:       Eyes: PERRL, lids and conjunctivae normal ENMT: Mucous membranes are moist. Posterior pharynx clear of any exudate or lesions.Normal dentition.  Neck: normal, supple, no masses, no thyromegaly Respiratory: clear to auscultation bilaterally, no wheezing, no crackles. Normal respiratory effort. No  accessory muscle use.  Cardiovascular: Regular rate and rhythm, no murmurs / rubs / gallops. No extremity edema. 2+ pedal pulses.  Abdomen: no tenderness, no masses palpated. No hepatosplenomegaly. Bowel sounds positive.  Musculoskeletal: no clubbing / cyanosis. No joint deformity upper and lower extremities. Good ROM, no contractures. Normal muscle tone.  Skin: no rashes, lesions, ulcers. No induration Neurologic: CN 2-12 grossly intact. Sensation intact, . Strength 5/5 in all 4.  Psychiatric: Normal judgment and insight. Alert and oriented x 3. Normal mood.    Labs on Admission: I have personally reviewed following labs and imaging studies  CBC: Recent Labs  Lab 08/05/22  0217 08/05/22 0521  WBC 9.4  --   HGB 12.0 11.1*  HCT 37.3 34.2*  MCV 87.4  --   PLT 409*  --    Basic Metabolic Panel: Recent Labs  Lab 08/05/22 0217  NA 133*  K 3.3*  CL 102  CO2 19*  GLUCOSE 66*  BUN 14  CREATININE 0.90  CALCIUM 8.8*   GFR: Estimated Creatinine Clearance: 98.4 mL/min (by C-G formula based on SCr of 0.9 mg/dL). Liver Function Tests: Recent Labs  Lab 08/05/22 0217  AST 19  ALT 19  ALKPHOS 57  BILITOT 0.5  PROT 7.7  ALBUMIN 4.2   No results for input(s): "LIPASE", "AMYLASE" in the last 168 hours. No results for input(s): "AMMONIA" in the last 168 hours. Coagulation Profile: No results for input(s): "INR", "PROTIME" in the last 168 hours. Cardiac Enzymes: No results for input(s): "CKTOTAL", "CKMB", "CKMBINDEX", "TROPONINI" in the last 168 hours. BNP (last 3 results) No results for input(s): "PROBNP" in the last 8760 hours. HbA1C: No results for input(s): "HGBA1C" in the last 72 hours. CBG: Recent Labs  Lab 08/05/22 0441  GLUCAP 87   Lipid Profile: No results for input(s): "CHOL", "HDL", "LDLCALC", "TRIG", "CHOLHDL", "LDLDIRECT" in the last 72 hours. Thyroid Function Tests: No results for input(s): "TSH", "T4TOTAL", "FREET4", "T3FREE", "THYROIDAB" in the last 72  hours. Anemia Panel: No results for input(s): "VITAMINB12", "FOLATE", "FERRITIN", "TIBC", "IRON", "RETICCTPCT" in the last 72 hours. Urine analysis:    Component Value Date/Time   COLORURINE YELLOW (A) 01/27/2020 1727   APPEARANCEUR CLEAR (A) 01/27/2020 1727   LABSPEC 1.012 01/27/2020 1727   PHURINE 5.0 01/27/2020 1727   GLUCOSEU NEGATIVE 01/27/2020 1727   HGBUR NEGATIVE 01/27/2020 1727   BILIRUBINUR NEGATIVE 01/27/2020 1727   KETONESUR NEGATIVE 01/27/2020 1727   PROTEINUR NEGATIVE 01/27/2020 1727   NITRITE NEGATIVE 01/27/2020 1727   LEUKOCYTESUR NEGATIVE 01/27/2020 1727    Radiological Exams on Admission: CT ABDOMEN PELVIS W CONTRAST  Result Date: 08/05/2022 CLINICAL DATA:  Left lower quadrant abdominal pain. Black tarry stools. Coughing up blood. EXAM: CT ABDOMEN AND PELVIS WITH CONTRAST TECHNIQUE: Multidetector CT imaging of the abdomen and pelvis was performed using the standard protocol following bolus administration of intravenous contrast. RADIATION DOSE REDUCTION: This exam was performed according to the departmental dose-optimization program which includes automated exposure control, adjustment of the mA and/or kV according to patient size and/or use of iterative reconstruction technique. CONTRAST:  OMNIPAQUE IOHEXOL 300 MG/ML  SOLN COMPARISON:  12/16/2019 FINDINGS: Lower chest:  No contributory findings. Hepatobiliary: No focal liver abnormality.Cholecystectomy. No biliary dilatation Pancreas: Unremarkable. Spleen: Unremarkable. Adrenals/Urinary Tract: Negative adrenals. No hydronephrosis or stone. Unremarkable bladder. Stomach/Bowel: Gastric bypass without complicating features. No bowel obstruction or visible inflammation. Vascular/Lymphatic: No acute vascular abnormality. No mass or adenopathy. Reproductive:Hysterectomy. Other: No ascites or pneumoperitoneum. Ventral hernia repair with mesh. Musculoskeletal: No acute abnormalities. IMPRESSION: No acute finding or explanation  for symptoms. Electronically Signed   By: Tiburcio Pea M.D.   On: 08/05/2022 04:33    EKG: Independently reviewed.  N/a Assessment/Plan   Upper GI bleed  -admit to progressive care  - no hx of etoh or alcohol use, no hx of bleeding disorder  -patient does have hx of GERD and has associated epigastric pain  - continue ppi  - npo  - gi to see  -await final recs  - stable h/h continue to trend , transfuse if < 7  Hypokalemia -replete prn   Hypoglycemia  -per patient recurrent  in setting of gastric bypass  -resolved s/p treatment in ED    Anxiety Depression ,fibromyalgia -resume home regimen   Migraine -no active issue   OSA on CPAP   aDVT prophylaxis: scd Code Status: full/ as discussed per patient wishes in event of cardiac arrest  Family Communication: .none at bedside Disposition Plan: patient  expected to be admitted greater than 2 midnights  Consults called: DR Kristeen Mans Admission status: progressive   Lurline Del MD [facility]   If 7PM-7AM, please contact night-coverage [email or web address] Password [facility]  08/05/2022, 7:31 AM

## 2022-08-06 ENCOUNTER — Inpatient Hospital Stay: Payer: 59 | Admitting: Anesthesiology

## 2022-08-06 ENCOUNTER — Encounter
Admission: EM | Disposition: A | Discharge status: Home / Self Care | Payer: Self-pay | Source: Home / Self Care | Attending: Internal Medicine

## 2022-08-06 ENCOUNTER — Encounter: Payer: Self-pay | Admitting: Internal Medicine

## 2022-08-06 DIAGNOSIS — K922 Gastrointestinal hemorrhage, unspecified: Secondary | ICD-10-CM | POA: Diagnosis not present

## 2022-08-06 HISTORY — PX: ESOPHAGOGASTRODUODENOSCOPY (EGD) WITH PROPOFOL: SHX5813

## 2022-08-06 LAB — PROTIME-INR
INR: 1.1 (ref 0.8–1.2)
Prothrombin Time: 14.3 seconds (ref 11.4–15.2)

## 2022-08-06 LAB — CBC
HCT: 34.1 % — ABNORMAL LOW (ref 36.0–46.0)
Hemoglobin: 10.9 g/dL — ABNORMAL LOW (ref 12.0–15.0)
MCH: 27.9 pg (ref 26.0–34.0)
MCHC: 32 g/dL (ref 30.0–36.0)
MCV: 87.2 fL (ref 80.0–100.0)
Platelets: 324 10*3/uL (ref 150–400)
RBC: 3.91 MIL/uL (ref 3.87–5.11)
RDW: 15.3 % (ref 11.5–15.5)
WBC: 5.5 10*3/uL (ref 4.0–10.5)
nRBC: 0 % (ref 0.0–0.2)

## 2022-08-06 LAB — BASIC METABOLIC PANEL
Anion gap: 4 — ABNORMAL LOW (ref 5–15)
BUN: 8 mg/dL (ref 6–20)
CO2: 24 mmol/L (ref 22–32)
Calcium: 8 mg/dL — ABNORMAL LOW (ref 8.9–10.3)
Chloride: 112 mmol/L — ABNORMAL HIGH (ref 98–111)
Creatinine, Ser: 0.85 mg/dL (ref 0.44–1.00)
GFR, Estimated: >60 mL/min (ref >60)
Glucose, Bld: 87 mg/dL (ref 70–99)
Potassium: 3.6 mmol/L (ref 3.5–5.1)
Sodium: 139 mmol/L (ref 135–145)

## 2022-08-06 SURGERY — ESOPHAGOGASTRODUODENOSCOPY (EGD) WITH PROPOFOL
Anesthesia: General

## 2022-08-06 MED ORDER — ACETAMINOPHEN 325 MG PO TABS: 650.0000 mg | ORAL_TABLET | Freq: Once | ORAL | Status: DC | PRN

## 2022-08-06 MED ORDER — GLYCOPYRROLATE 0.2 MG/ML IJ SOLN
INTRAMUSCULAR | Status: AC
  Filled 2022-08-06: qty 1

## 2022-08-06 MED ORDER — PANTOPRAZOLE SODIUM 40 MG PO TBEC: 40.0000 mg | DELAYED_RELEASE_TABLET | Freq: Two times a day (BID) | ORAL | 0 refills | Status: DC

## 2022-08-06 MED ORDER — PROPOFOL 1000 MG/100ML IV EMUL
INTRAVENOUS | Status: AC
  Filled 2022-08-06: qty 100

## 2022-08-06 MED ORDER — HYDROXYZINE HCL 25 MG PO TABS
50.0000 mg | ORAL_TABLET | Freq: Once | ORAL | Status: AC
  Administered 2022-08-06: 50 mg via ORAL
  Filled 2022-08-06: qty 2

## 2022-08-06 MED ORDER — PANTOPRAZOLE SODIUM 40 MG PO TBEC: 40.0000 mg | DELAYED_RELEASE_TABLET | Freq: Every day | ORAL | Status: DC

## 2022-08-06 MED ORDER — ONDANSETRON HCL 4 MG/2ML IJ SOLN: 4.0000 mg | Freq: Once | INTRAMUSCULAR | Status: DC | PRN

## 2022-08-06 MED ORDER — LIDOCAINE HCL (PF) 2 % IJ SOLN
INTRAMUSCULAR | Status: AC
  Filled 2022-08-06: qty 5

## 2022-08-06 MED ORDER — FENTANYL CITRATE (PF) 100 MCG/2ML IJ SOLN
INTRAMUSCULAR | Status: AC
  Filled 2022-08-06: qty 2

## 2022-08-06 MED ORDER — LIDOCAINE HCL (CARDIAC) PF 100 MG/5ML IV SOSY
PREFILLED_SYRINGE | INTRAVENOUS | Status: DC | PRN
  Administered 2022-08-06: 80 mg via INTRAVENOUS

## 2022-08-06 MED ORDER — SODIUM CHLORIDE 0.9 % IV SOLN: INTRAVENOUS | Status: DC

## 2022-08-06 MED ORDER — GLYCOPYRROLATE 0.2 MG/ML IJ SOLN
INTRAMUSCULAR | Status: DC | PRN
  Administered 2022-08-06: .2 mg via INTRAVENOUS

## 2022-08-06 MED ORDER — ACETAMINOPHEN 160 MG/5ML PO SOLN: 325.0000 mg | ORAL | Status: DC | PRN

## 2022-08-06 MED ORDER — FENTANYL CITRATE (PF) 100 MCG/2ML IJ SOLN
INTRAMUSCULAR | Status: DC | PRN
  Administered 2022-08-06: 50 ug via INTRAVENOUS

## 2022-08-06 MED ORDER — PROPOFOL 10 MG/ML IV BOLUS
INTRAVENOUS | Status: DC | PRN
  Administered 2022-08-06: 30 mg via INTRAVENOUS
  Administered 2022-08-06: 70 mg via INTRAVENOUS

## 2022-08-06 NOTE — Discharge Summary (Signed)
Physician Discharge Summary   Patient: Karen Park MRN: [id number] DOB: 1981/02/13  Admit date:     08/05/2022  Discharge date: 08/06/22  Discharge Physician: Enedina Finner   PCP: Carren Rang, PA-C   Recommendations at discharge:   follow-up G.I. Dr. Servando Snare in 1 to 2 weeks follow-up PCP in 1 to 2 week  Discharge Diagnoses: Principal Problem:   UGIB (upper gastrointestinal bleed)   Karen Park is a 42 y.o. female with medical history significant of  Anxiety, depression, migraine, history of hiatal hernia s/p repair,fibromyalgia, OSA on CPAP, abnormal uterine bleeding, recurrent epistaxis, GERD,OCD, who  presents to ED with complaint of black tarry stools associated with upper  and lower abdominal pain, as well as shortness of breath. Patient denies any prior history of blood in stools. Patient also denies use of nsaids or blood thinners.  Upper G.I. bleed history of gastric bypass -- patient came in with some nausea and black tarry stools. -- She was seen by G.I. recommended EGD. -- EGD showed  - Normal esophagus.                        - Gastric bypass with a small-sized pouch and intact                         staple line. Gastrojejunal anastomosis characterized                         by inflammation.                        - Normal examined jejunum.                        - No specimens collected. -- Okay with G.I. to discharge home with PO Protonix. --- Patient agreeable with plan show follow-up with G.I. as needed  Hypokalemia replace potassium as needed  anxiety/depression/fibromyalgia -- resume home meds  Discharge to home and follow-up G.I.     Consultants: gastroenterology Procedures performed: EGD Disposition: Home Diet recommendation:  Discharge Diet Orders (From admission, onward)     Start     Ordered   08/06/22 0000  Diet - low sodium heart healthy        08/06/22 1426            DISCHARGE MEDICATION: Allergies as of 08/06/2022        Reactions   Other Swelling   Blue cheese.        Medication List     STOP taking these medications    amoxicillin-clavulanate 875-125 MG tablet Commonly known as: Augmentin   gabapentin 300 MG capsule Commonly known as: Neurontin   montelukast 10 MG tablet Commonly known as: SINGULAIR   predniSONE 10 MG (21) Tbpk tablet Commonly known as: STERAPRED UNI-PAK 21 TAB       TAKE these medications    buPROPion 150 MG 24 hr tablet Commonly known as: WELLBUTRIN XL Take 1 tablet (150 mg total) by mouth daily.   busPIRone 7.5 MG tablet Commonly known as: BUSPAR Take 1 tablet (7.5 mg total) by mouth 2 (two) times daily.   cetirizine 10 MG tablet Commonly known as: ZYRTEC Take 10 mg by mouth at bedtime.   clotrimazole-betamethasone cream Commonly known as: LOTRISONE Apply 1 Application topically 2 (two) times daily.   FLUoxetine  40 MG capsule Commonly known as: PROZAC Take 2 capsules (80 mg total) by mouth daily.   hydrOXYzine 50 MG tablet Commonly known as: ATARAX Take 1 tablet (50 mg total) by mouth at bedtime.   nicotine 14 mg/24hr patch Commonly known as: NICODERM CQ - dosed in mg/24 hours Place 1 patch (14 mg total) onto the skin daily.   nystatin powder Commonly known as: MYCOSTATIN/NYSTOP Apply 1 Application topically 2 (two) times daily as needed.   pantoprazole 40 MG tablet Commonly known as: PROTONIX Take 1 tablet (40 mg total) by mouth 2 (two) times daily before a meal.   thiamine 100 MG tablet Commonly known as: VITAMIN B1 Take 100 mg by mouth every morning.   traZODone 50 MG tablet Commonly known as: DESYREL Take 25 mg by mouth at bedtime as needed. What changed: Another medication with the same name was removed. Continue taking this medication, and follow the directions you see here.        Follow-up Information     Carren Rang, PA-C. Schedule an appointment as soon as possible for a visit in 1 day.   Specialty: Physician  Assistant Contact information: [address] [place] [address] [phone]         Midge Minium, MD. Schedule an appointment as soon as possible for a visit in 2 days.   Specialty: Gastroenterology Contact information: [address] [place]  [place] [address] [phone]                Discharge Exam: Ceasar Mons Weights   08/05/22 0211 08/06/22 1202  Weight: 102.1 kg 102.1 kg   Alert and oriented times three. Obese abdomen soft benign nontender respiratory clear to auscultation no respiratory distress cardiovascular both heart sounds normal rate rhythm regular. No murmur  Condition at discharge: good  The results of significant diagnostics from this hospitalization (including imaging, microbiology, ancillary and laboratory) are listed below for reference.   Imaging Studies: CT ABDOMEN PELVIS W CONTRAST  Result Date: 08/05/2022 CLINICAL DATA:  Left lower quadrant abdominal pain. Black tarry stools. Coughing up blood. EXAM: CT ABDOMEN AND PELVIS WITH CONTRAST TECHNIQUE: Multidetector CT imaging of the abdomen and pelvis was performed using the standard protocol following bolus administration of intravenous contrast. RADIATION DOSE REDUCTION: This exam was performed according to the departmental dose-optimization program which includes automated exposure control, adjustment of the mA and/or kV according to patient size and/or use of iterative reconstruction technique. CONTRAST:  OMNIPAQUE IOHEXOL 300 MG/ML  SOLN COMPARISON:  12/16/2019 FINDINGS: Lower chest:  No contributory findings. Hepatobiliary: No focal liver abnormality.Cholecystectomy. No biliary dilatation Pancreas: Unremarkable. Spleen: Unremarkable. Adrenals/Urinary Tract: Negative adrenals. No hydronephrosis or stone. Unremarkable bladder. Stomach/Bowel: Gastric bypass without complicating features. No bowel obstruction or visible inflammation. Vascular/Lymphatic: No acute vascular  abnormality. No mass or adenopathy. Reproductive:Hysterectomy. Other: No ascites or pneumoperitoneum. Ventral hernia repair with mesh. Musculoskeletal: No acute abnormalities. IMPRESSION: No acute finding or explanation for symptoms. Electronically Signed   By: Tiburcio Pea M.D.   On: 08/05/2022 04:33    Microbiology: Results for orders placed or performed during the hospital encounter of 12/02/21  Resp Panel by RT-PCR (Flu A&B, Covid) Anterior Nasal Swab     Status: None   Collection Time: 12/03/21 12:20 AM   Specimen: Anterior Nasal Swab  Result Value Ref Range Status   SARS Coronavirus 2 by RT PCR NEGATIVE NEGATIVE Final    Comment: (NOTE) SARS-CoV-2 target nucleic acids are NOT DETECTED.  The SARS-CoV-2  RNA is generally detectable in upper respiratory specimens during the acute phase of infection. The lowest concentration of SARS-CoV-2 viral copies this assay can detect is 138 copies/mL. A negative result does not preclude SARS-Cov-2 infection and should not be used as the sole basis for treatment or other patient management decisions. A negative result may occur with  improper specimen collection/handling, submission of specimen other than nasopharyngeal swab, presence of viral mutation(s) within the areas targeted by this assay, and inadequate number of viral copies(<138 copies/mL). A negative result must be combined with clinical observations, patient history, and epidemiological information. The expected result is Negative.  Fact Sheet for Patients:  BloggerCourse.com  Fact Sheet for Healthcare Providers:  SeriousBroker.it  This test is no t yet approved or cleared by the [place] FDA and  has been authorized for detection and/or diagnosis of SARS-CoV-2 by FDA under an Emergency Use Authorization (EUA). This EUA will remain  in effect (meaning this test can be used) for the duration of the COVID-19 declaration under  Section 564(b)(1) of the Act, 21 U.S.C.section 360bbb-3(b)(1), unless the authorization is terminated  or revoked sooner.       Influenza A by PCR NEGATIVE NEGATIVE Final   Influenza B by PCR NEGATIVE NEGATIVE Final    Comment: (NOTE) The Xpert Xpress SARS-CoV-2/FLU/RSV plus assay is intended as an aid in the diagnosis of influenza from Nasopharyngeal swab specimens and should not be used as a sole basis for treatment. Nasal washings and aspirates are unacceptable for Xpert Xpress SARS-CoV-2/FLU/RSV testing.  Fact Sheet for Patients: BloggerCourse.com  Fact Sheet for Healthcare Providers: SeriousBroker.it  This test is not yet approved or cleared by the [place] FDA and has been authorized for detection and/or diagnosis of SARS-CoV-2 by FDA under an Emergency Use Authorization (EUA). This EUA will remain in effect (meaning this test can be used) for the duration of the COVID-19 declaration under Section 564(b)(1) of the Act, 21 U.S.C. section 360bbb-3(b)(1), unless the authorization is terminated or revoked.  Performed at [facility], [address] [place], [place] [address]     Labs: CBC: Recent Labs  Lab 08/05/22 0217 08/05/22 0521 08/05/22 0803 08/05/22 1352 08/05/22 1930 08/06/22 0503  WBC 9.4  --   --   --   --  5.5  HGB 12.0 11.1* 10.9* 10.9* 10.8* 10.9*  HCT 37.3 34.2*  --   --   --  34.1*  MCV 87.4  --   --   --   --  87.2  PLT 409*  --   --   --   --  324   Basic Metabolic Panel: Recent Labs  Lab 08/05/22 0217 08/06/22 0503  NA 133* 139  K 3.3* 3.6  CL 102 112*  CO2 19* 24  GLUCOSE 66* 87  BUN 14 8  CREATININE 0.90 0.85  CALCIUM 8.8* 8.0*   Liver Function Tests: Recent Labs  Lab 08/05/22 0217  AST 19  ALT 19  ALKPHOS 57  BILITOT 0.5  PROT 7.7  ALBUMIN 4.2   CBG: Recent Labs  Lab 08/05/22 0441  GLUCAP 87    Discharge time spent: greater than 30  minutes.  Signed: Enedina Finner, MD [facility] 08/06/2022

## 2022-08-06 NOTE — Anesthesia Postprocedure Evaluation (Signed)
Anesthesia Post Note  Patient: Karen Park  Procedure(s) Performed: ESOPHAGOGASTRODUODENOSCOPY (EGD) WITH PROPOFOL  Patient location during evaluation: PACU Anesthesia Type: General Level of consciousness: awake and alert, oriented and patient cooperative Pain management: pain level controlled Vital Signs Assessment: post-procedure vital signs reviewed and stable Respiratory status: spontaneous breathing, nonlabored ventilation and respiratory function stable Cardiovascular status: blood pressure returned to baseline and stable Postop Assessment: adequate PO intake Anesthetic complications: no   No notable events documented.   Last Vitals:  Vitals:   08/06/22 1310 08/06/22 1315  BP: 115/63   Pulse: 65 61  Resp: 11 13  Temp:    SpO2: 97% 97%    Last Pain:  Vitals:   08/06/22 1315  TempSrc:   PainSc: 0-No pain                 Reed Breech

## 2022-08-06 NOTE — Anesthesia Preprocedure Evaluation (Addendum)
Anesthesia Evaluation  Patient identified by MRN, date of birth, ID band Patient awake    Reviewed: Allergy & Precautions, NPO status , Patient's Chart, lab work & pertinent test results  History of Anesthesia Complications Negative for: history of anesthetic complications  Airway Mallampati: I   Neck ROM: Full    Dental  (+) Chipped   Pulmonary sleep apnea and Continuous Positive Airway Pressure Ventilation , former smoker (quit 2021)   Pulmonary exam normal breath sounds clear to auscultation       Cardiovascular Exercise Tolerance: Good negative cardio ROS Normal cardiovascular exam Rhythm:Regular Rate:Normal     Neuro/Psych  Headaches PSYCHIATRIC DISORDERS Anxiety Depression       GI/Hepatic hiatal hernia,,,S/p gastric bypass   Endo/Other  Obesity   Renal/GU Renal disease (nephrolithiasis)     Musculoskeletal   Abdominal   Peds  Hematology  (+) Blood dyscrasia, anemia   Anesthesia Other Findings   Reproductive/Obstetrics                             Anesthesia Physical Anesthesia Plan  ASA: 2  Anesthesia Plan: General   Post-op Pain Management:    Induction: Intravenous  PONV Risk Score and Plan: 2 and Propofol infusion, TIVA and Treatment may vary due to age or medical condition  Airway Management Planned: Natural Airway  Additional Equipment:   Intra-op Plan:   Post-operative Plan:   Informed Consent: I have reviewed the patients History and Physical, chart, labs and discussed the procedure including the risks, benefits and alternatives for the proposed anesthesia with the patient or authorized representative who has indicated his/her understanding and acceptance.       Plan Discussed with: CRNA  Anesthesia Plan Comments: (LMA/GETA backup discussed.  Patient consented for risks of anesthesia including but not limited to:  - adverse reactions to medications -  damage to eyes, teeth, lips or other oral mucosa - nerve damage due to positioning  - sore throat or hoarseness - damage to heart, brain, nerves, lungs, other parts of body or loss of life  Informed patient about role of CRNA in peri- and intra-operative care.  Patient voiced understanding.)       Anesthesia Quick Evaluation

## 2022-08-06 NOTE — Transfer of Care (Signed)
Immediate Anesthesia Transfer of Care Note  Patient: Karen Park  Procedure(s) Performed: ESOPHAGOGASTRODUODENOSCOPY (EGD) WITH PROPOFOL  Patient Location: PACU  Anesthesia Type:General  Level of Consciousness: awake  Airway & Oxygen Therapy: Patient Spontanous Breathing  Post-op Assessment: Report given to RN  Post vital signs: Reviewed and stable  Last Vitals:  Vitals Value Taken Time  BP 106/59 08/06/22 1243  Temp    Pulse 72 08/06/22 1244  Resp 13 08/06/22 1244  SpO2 96 % 08/06/22 1244  Vitals shown include unvalidated device data.  Last Pain:  Vitals:   08/06/22 1202  TempSrc: Temporal  PainSc: 0-No pain         Complications: No notable events documented.

## 2022-08-06 NOTE — Op Note (Signed)
[facility] Gastroenterology Patient Name: Karen Park Procedure Date: 08/06/2022 11:08 AM MRN: [id number] Account #: [id number] Date of Birth: Jun 12, 1980 Admit Type: Inpatient Age: 42 Room: [facility] ENDO ROOM 3 Gender: Female Note Status: Finalized Instrument Name: Upper Endoscope [id number] Procedure:             Upper GI endoscopy Indications:           Melena Providers:             Midge Minium MD, MD Medicines:             Propofol per Anesthesia Complications:         No immediate complications. Procedure:             Pre-Anesthesia Assessment:                        - Prior to the procedure, a History and Physical was                         performed, and patient medications and allergies were                         reviewed. The patient's tolerance of previous                         anesthesia was also reviewed. The risks and benefits                         of the procedure and the sedation options and risks                         were discussed with the patient. All questions were                         answered, and informed consent was obtained. Prior                         Anticoagulants: The patient has taken no anticoagulant                         or antiplatelet agents. ASA Grade Assessment: II - A                         patient with mild systemic disease. After reviewing                         the risks and benefits, the patient was deemed in                         satisfactory condition to undergo the procedure.                        After obtaining informed consent, the endoscope was                         passed under direct vision. Throughout the procedure,                         the patient's blood  pressure, pulse, and oxygen                         saturations were monitored continuously. The Endoscope                         was introduced through the mouth, and advanced to the                         jejunum. The upper GI  endoscopy was accomplished                         without difficulty. The patient tolerated the                         procedure well. Findings:      The examined esophagus was normal.      Evidence of a gastric bypass was found. A gastric pouch with a small       size was found. The staple line appeared intact. The gastrojejunal       anastomosis was characterized by inflammation. This was traversed.      The examined jejunum was normal. Impression:            - Normal esophagus.                        - Gastric bypass with a small-sized pouch and intact                         staple line. Gastrojejunal anastomosis characterized                         by inflammation.                        - Normal examined jejunum.                        - No specimens collected. Recommendation:        - Return patient to hospital ward for ongoing care.                        - Resume regular diet.                        - Continue present medications. Procedure Code(s):     --- Professional ---                        [phone], Esophagogastroduodenoscopy, flexible,                         transoral; diagnostic, including collection of                         specimen(s) by brushing or washing, when performed                         (separate procedure) Diagnosis Code(s):     --- Professional ---  K92.1, Melena (includes Hematochezia) CPT copyright 2022 American Medical Association. All rights reserved. The codes documented in this report are preliminary and upon coder review may  be revised to meet current compliance requirements. Midge Minium MD, MD 08/06/2022 12:37:33 PM This report has been signed electronically. Number of Addenda: 0 Note Initiated On: 08/06/2022 11:08 AM Estimated Blood Loss:  Estimated blood loss: none.      [facility]

## 2022-08-08 ENCOUNTER — Encounter: Payer: Self-pay | Admitting: Gastroenterology

## 2022-08-08 LAB — HEMOGLOBIN A1C
Hgb A1c MFr Bld: 5.4 % (ref 4.8–5.6)
Mean Plasma Glucose: 108 mg/dL

## 2022-09-11 ENCOUNTER — Encounter: Payer: Self-pay | Admitting: Emergency Medicine

## 2022-09-11 ENCOUNTER — Emergency Department
Admission: EM | Admit: 2022-09-11 | Discharge: 2022-09-11 | Disposition: A | Discharge status: Home / Self Care | Payer: 59 | Attending: Emergency Medicine | Admitting: Emergency Medicine

## 2022-09-11 ENCOUNTER — Other Ambulatory Visit: Payer: Self-pay

## 2022-09-11 ENCOUNTER — Emergency Department: Payer: 59

## 2022-09-11 DIAGNOSIS — R112 Nausea with vomiting, unspecified: Secondary | ICD-10-CM

## 2022-09-11 DIAGNOSIS — R197 Diarrhea, unspecified: Secondary | ICD-10-CM | POA: Diagnosis present

## 2022-09-11 DIAGNOSIS — R748 Abnormal levels of other serum enzymes: Secondary | ICD-10-CM | POA: Diagnosis not present

## 2022-09-11 DIAGNOSIS — K529 Noninfective gastroenteritis and colitis, unspecified: Secondary | ICD-10-CM | POA: Insufficient documentation

## 2022-09-11 LAB — CBC
HCT: 35.4 % — ABNORMAL LOW (ref 36.0–46.0)
Hemoglobin: 11.1 g/dL — ABNORMAL LOW (ref 12.0–15.0)
MCH: 28.4 pg (ref 26.0–34.0)
MCHC: 31.4 g/dL (ref 30.0–36.0)
MCV: 90.5 fL (ref 80.0–100.0)
Platelets: 410 10*3/uL — ABNORMAL HIGH (ref 150–400)
RBC: 3.91 MIL/uL (ref 3.87–5.11)
RDW: 14.8 % (ref 11.5–15.5)
WBC: 7.6 10*3/uL (ref 4.0–10.5)
nRBC: 0 % (ref 0.0–0.2)

## 2022-09-11 LAB — COMPREHENSIVE METABOLIC PANEL
ALT: 13 U/L (ref 0–44)
AST: 16 U/L (ref 15–41)
Albumin: 4 g/dL (ref 3.5–5.0)
Alkaline Phosphatase: 62 U/L (ref 38–126)
Anion gap: 10 (ref 5–15)
BUN: 10 mg/dL (ref 6–20)
CO2: 21 mmol/L — ABNORMAL LOW (ref 22–32)
Calcium: 8.7 mg/dL — ABNORMAL LOW (ref 8.9–10.3)
Chloride: 105 mmol/L (ref 98–111)
Creatinine, Ser: 0.97 mg/dL (ref 0.44–1.00)
GFR, Estimated: >60 mL/min (ref >60)
Glucose, Bld: 88 mg/dL (ref 70–99)
Potassium: 3.8 mmol/L (ref 3.5–5.1)
Sodium: 136 mmol/L (ref 135–145)
Total Bilirubin: 0.4 mg/dL (ref 0.3–1.2)
Total Protein: 7.3 g/dL (ref 6.5–8.1)

## 2022-09-11 LAB — URINALYSIS, ROUTINE W REFLEX MICROSCOPIC
Bilirubin Urine: NEGATIVE
Glucose, UA: NEGATIVE mg/dL
Hgb urine dipstick: NEGATIVE
Ketones, ur: NEGATIVE mg/dL
Leukocytes,Ua: NEGATIVE
Nitrite: NEGATIVE
Protein, ur: NEGATIVE mg/dL
Specific Gravity, Urine: 1.005 (ref 1.005–1.030)
pH: 5 (ref 5.0–8.0)

## 2022-09-11 LAB — LIPASE, BLOOD: Lipase: 61 U/L — ABNORMAL HIGH (ref 11–51)

## 2022-09-11 MED ORDER — ONDANSETRON HCL 4 MG/2ML IJ SOLN
4.0000 mg | Freq: Once | INTRAMUSCULAR | Status: AC
  Administered 2022-09-11: 4 mg via INTRAVENOUS
  Filled 2022-09-11: qty 2

## 2022-09-11 MED ORDER — IOHEXOL 300 MG/ML  SOLN
100.0000 mL | Freq: Once | INTRAMUSCULAR | Status: AC | PRN
  Administered 2022-09-11: 100 mL via INTRAVENOUS

## 2022-09-11 MED ORDER — DICYCLOMINE HCL 10 MG PO CAPS: 10.0000 mg | ORAL_CAPSULE | Freq: Three times a day (TID) | ORAL | 0 refills | Status: DC

## 2022-09-11 MED ORDER — ONDANSETRON 4 MG PO TBDP
4.0000 mg | ORAL_TABLET | Freq: Once | ORAL | Status: AC | PRN
  Administered 2022-09-11: 4 mg via ORAL
  Filled 2022-09-11: qty 1

## 2022-09-11 MED ORDER — LOPERAMIDE HCL 2 MG PO CAPS
2.0000 mg | ORAL_CAPSULE | Freq: Once | ORAL | Status: AC
  Administered 2022-09-11: 2 mg via ORAL
  Filled 2022-09-11: qty 1

## 2022-09-11 MED ORDER — MORPHINE SULFATE (PF) 4 MG/ML IV SOLN
4.0000 mg | Freq: Once | INTRAVENOUS | Status: AC
  Administered 2022-09-11: 4 mg via INTRAVENOUS
  Filled 2022-09-11: qty 1

## 2022-09-11 MED ORDER — ONDANSETRON 4 MG PO TBDP: 4.0000 mg | ORAL_TABLET | Freq: Three times a day (TID) | ORAL | 0 refills | Status: DC | PRN

## 2022-09-11 NOTE — ED Notes (Signed)
ED Provider at bedside. 

## 2022-09-11 NOTE — ED Provider Notes (Signed)
[facility] Emergency Department Provider Note     Event Date/Time   First MD Initiated Contact with Patient 09/11/22 1932     (approximate)   History   Abdominal Pain   HPI  Karen Park is a 42 y.o. female with a history of anxiety, depression, and kidney stones presents to the ED with diarrheal symptoms that started on Thursday, 4 days prior to arrival.  She reports nonbloody, nonbilious emesis with some referral into the left flank.  She denies any frank fevers or chills.  She reports watery diarrhea with every stool.  She denies any sick contacts, or bad food exposure.  Does endorse a trip to [place] last week.  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 --      Excl. in GC? --     Most recent vital signs: Vitals:   09/11/22 1923  BP: 137/79  Pulse: 81  Resp: (!) 21  Temp: 99.4 F (37.4 C)  SpO2: 100%    General Awake, no distress. NAD CV:  Good peripheral perfusion.  RESP:  Normal effort.  ABD:  No distention.  Soft and nontender to palpation.  Hyperactive bowel sounds x 4.  No rebound, guarding, or rigidity noted.  No CVA tenderness elicited.   ED Results / Procedures / Treatments   Labs (all labs ordered are listed, but only abnormal results are displayed) Labs Reviewed  LIPASE, BLOOD - Abnormal; Notable for the  following components:      Result Value   Lipase 61 (*)    All other components within normal limits  COMPREHENSIVE METABOLIC PANEL - Abnormal; Notable for the following components:   CO2 21 (*)    Calcium 8.7 (*)    All other components within normal limits  CBC - Abnormal; Notable for the following components:   Hemoglobin 11.1 (*)    HCT 35.4 (*)    Platelets 410 (*)    All other components within normal limits  URINALYSIS, ROUTINE W REFLEX MICROSCOPIC - Abnormal; Notable for the following components:   Color, Urine YELLOW (*)    APPearance CLEAR (*)    All other components within normal limits  GASTROINTESTINAL PANEL BY PCR, STOOL (REPLACES STOOL CULTURE)  C DIFFICILE QUICK SCREEN W PCR REFLEX       EKG   RADIOLOGY  I personally viewed and evaluated these images as part of my medical decision making, as well as reviewing the written report by the radiologist.  ED Provider Interpretation:  no acute findings  CT ABDOMEN PELVIS W CONTRAST  Result Date: 09/11/2022 CLINICAL DATA:  Abdominal pain, acute, nonlocalized, nausea, vomiting, diarrhea. Left flank pain. EXAM: CT ABDOMEN AND PELVIS WITH CONTRAST TECHNIQUE: Multidetector CT imaging of the abdomen and pelvis was performed using the standard protocol following bolus administration of intravenous contrast. RADIATION DOSE REDUCTION: This exam was performed according to the departmental dose-optimization program which includes automated exposure control, adjustment of the mA and/or kV according to patient size and/or use of iterative reconstruction technique. CONTRAST:  OMNIPAQUE IOHEXOL 300 MG/ML  SOLN COMPARISON:  None Available. FINDINGS: Lower chest: No acute abnormality. Hepatobiliary: No focal liver abnormality is seen. Status post cholecystectomy. No biliary dilatation. Pancreas: Unremarkable Spleen: Unremarkable Adrenals/Urinary Tract: Adrenal glands are unremarkable. Kidneys are normal, without renal calculi, focal  lesion, or hydronephrosis. Bladder is unremarkable. Stomach/Bowel: Surgical changes of Roux-en-Y gastric bypass are identified. The stomach, small bowel, and large bowel are otherwise unremarkable and there is no evidence of obstruction or focal inflammation. Appendix normal. No free intraperitoneal gas or fluid. Vascular/Lymphatic: No significant vascular findings are present. No enlarged abdominal or pelvic lymph nodes. Reproductive: Status post hysterectomy. No adnexal masses. Other: Ventral hernia repair with mesh has been performed. No recurrent abdominal wall hernia Musculoskeletal: No acute bone abnormality. No lytic or blastic bone lesion. IMPRESSION: 1. No acute intra-abdominal pathology identified. No definite radiographic explanation for the patient's reported symptoms. 2. Surgical changes of Roux-en-Y gastric bypass. No evidence of obstruction or focal inflammation. 3. Status post ventral hernia repair with mesh. No recurrent abdominal wall hernia. Electronically Signed   By: Helyn Numbers M.D.   On: 09/11/2022 21:09     PROCEDURES:  Critical Care performed: No  Procedures   MEDICATIONS ORDERED IN ED: Medications  loperamide (IMODIUM) capsule 2 mg (has no administration in time range)  ondansetron (ZOFRAN-ODT) disintegrating tablet 4 mg (4 mg Oral Given 09/11/22 1932)  morphine (PF) 4 MG/ML injection 4 mg (4 mg Intravenous Given 09/11/22 2025)  ondansetron (ZOFRAN) injection 4 mg (4 mg Intravenous Given 09/11/22 2025)  iohexol (OMNIPAQUE) 300 MG/ML solution 100 mL (100 mLs Intravenous Contrast Given 09/11/22 2037)     IMPRESSION / MDM / ASSESSMENT AND PLAN / ED COURSE  I reviewed the triage vital signs and the nursing notes.                              Differential diagnosis includes, but is not limited to, ovarian cyst, ovarian torsion, acute appendicitis, diverticulitis, urinary tract infection/pyelonephritis, bowel obstruction, colitis, renal colic, gastroenteritis, hernia,  etc.  Patient's presentation is most consistent with acute complicated illness / injury requiring diagnostic workup.  Patient's diagnosis is consistent with likely viral gastroenteritis.  With reassuring exam and workup overall.  No lab abnormalities appreciated except for a mildly elevated lipase at 61.  No acute leukocytosis or critical anemia noted.  No evidence of leukocyturia.  Patient CT scan was negative for without any evidence of an acute abdominal pelvic process to explain the patient's symptoms.  Patient will be discharged home with prescriptions for Zofran and Bentyl. Patient is to follow up with her PCP or gastroenterology specialist as needed or otherwise directed. Patient is given ED precautions to return to the ED for any worsening or new symptoms.  Clinical Course as of 09/11/22 2317  Mon Sep 11, 2022  2011 Lipase(!): 61 [JM]    Clinical Course User Index [JM] Lynnea Vandervoort, Charlesetta Ivory, PA-C    FINAL CLINICAL IMPRESSION(S) / ED DIAGNOSES   Final diagnoses:  Nausea vomiting and diarrhea  Gastroenteritis presumed infectious     Rx / DC Orders   ED Discharge Orders          Ordered    ondansetron (ZOFRAN-ODT) 4 MG disintegrating tablet  Every 8 hours PRN        09/11/22 2312    dicyclomine (BENTYL) 10 MG capsule  3 times daily before meals        09/11/22 2312             Note:  This document was prepared using Dragon voice recognition software and may include unintentional dictation errors.    Lissa Hoard, PA-C 09/11/22 2317    Chesley Noon, MD 09/12/22 [phone]

## 2022-09-11 NOTE — Discharge Instructions (Addendum)
Your exam, labs, and CT scan are normal and reassuring at this time.  No signs of any serious underlying abdominal pelvic findings to explain your symptoms.  You may be experiencing nausea, vomiting, diarrhea test secondary to a viral infection.  Continue to hydrate to prevent dehydration.  Take the nausea medicine as needed.  Consider OTC antidiarrheal medicine for ongoing symptom relief.  Carb-rich foods to help reduce diarrheal symptoms.  Follow-up with your primary provider.

## 2022-09-11 NOTE — ED Notes (Signed)
Patient transported to CT 

## 2022-09-11 NOTE — ED Triage Notes (Signed)
Pt c/o lower abd pain that started Thursday. Pain associated NVD. Pain radiated into the left flank area. No fevers/chills. No known sick exposure. Recently went to [place] last week. No urinary s/s.

## 2023-05-02 ENCOUNTER — Ambulatory Visit
Admission: EM | Admit: 2023-05-02 | Discharge: 2023-05-02 | Disposition: A | Discharge status: Home / Self Care | Attending: Emergency Medicine | Admitting: Emergency Medicine

## 2023-05-02 DIAGNOSIS — J069 Acute upper respiratory infection, unspecified: Secondary | ICD-10-CM

## 2023-05-02 LAB — POC COVID19/FLU A&B COMBO
Covid Antigen, POC: NEGATIVE
Influenza A Antigen, POC: NEGATIVE
Influenza B Antigen, POC: NEGATIVE

## 2023-05-02 MED ORDER — ALBUTEROL SULFATE HFA 108 (90 BASE) MCG/ACT IN AERS: 2.0000 | INHALATION_SPRAY | Freq: Four times a day (QID) | RESPIRATORY_TRACT | 2 refills | Status: AC | PRN

## 2023-05-02 MED ORDER — BENZONATATE 100 MG PO CAPS: 100.0000 mg | ORAL_CAPSULE | Freq: Three times a day (TID) | ORAL | 0 refills | Status: DC

## 2023-05-02 MED ORDER — PROMETHAZINE-DM 6.25-15 MG/5ML PO SYRP: 5.0000 mL | ORAL_SOLUTION | Freq: Every evening | ORAL | 0 refills | Status: DC | PRN

## 2023-05-02 MED ORDER — ACETAMINOPHEN 325 MG PO TABS
650.0000 mg | ORAL_TABLET | Freq: Once | ORAL | Status: AC
  Administered 2023-05-02: 650 mg via ORAL

## 2023-05-02 NOTE — ED Provider Notes (Signed)
Karen Park    CSN: [id number] Arrival date & time: 05/02/23  1256      History   Chief Complaint Chief Complaint  Patient presents with   Cough    HPI Karen Park is a 43 y.o. female.   Patient presents for evaluation of nasal congestion, rhinorrhea, productive cough, bilateral ear pain, left side worse than right, sore throat, shortness of breath with exertion and wheezing beginning overnight.  Has not attempted treatment of symptoms.  Tolerating food and liquids.  History of bronchitis, former smoker, vapes daily.  Past Medical History:  Diagnosis Date   Anxiety    Arthritis    both knees, fingers, spine   Depression    History of hiatal hernia    repair 2020   History of kidney stones    Migraine    Pneumonia     Patient Active Problem List   Diagnosis Date Noted   UGIB (upper gastrointestinal bleed) 08/05/2022   Severe recurrent major depression without psychotic features (HCC) 02/11/2021   MDD (major depressive disorder), recurrent episode, moderate (HCC) 02/10/2021   Alcohol abuse 02/10/2021   Menorrhagia 08/16/2020   Post-operative state 08/16/2020    Past Surgical History:  Procedure Laterality Date   ABDOMINAL HYSTERECTOMY     BARIATRIC SURGERY  01/02/2018   CESAREAN SECTION     CHOLECYSTECTOMY     COLONOSCOPY     dental work     ESOPHAGOGASTRODUODENOSCOPY     ESOPHAGOGASTRODUODENOSCOPY (EGD) WITH PROPOFOL N/A 08/06/2022   Procedure: ESOPHAGOGASTRODUODENOSCOPY (EGD) WITH PROPOFOL;  Surgeon: Midge Minium, MD;  Location: [facility] ENDOSCOPY;  Service: Endoscopy;  Laterality: N/A;   exploratory laparoscopic     GASTRIC BYPASS     HERNIA REPAIR     x2   HYSTERECTOMY ABDOMINAL WITH SALPINGECTOMY Bilateral 08/16/2020   Procedure: HYSTERECTOMY ABDOMINAL WITH BILATERAL SALPINGECTOMY  (L) OOPHORECTOMY;  Surgeon: Schermerhorn, Ihor Austin, MD;  Location: [facility] ORS;  Service: Gynecology;  Laterality: Bilateral;   OOPHORECTOMY Left 08/16/2020    Procedure: OOPHORECTOMY;  Surgeon: Schermerhorn, Ihor Austin, MD;  Location: [facility] ORS;  Service: Gynecology;  Laterality: Left;    OB History   No obstetric history on file.      Home Medications    Prior to Admission medications   Medication Sig Start Date End Date Taking? Authorizing Provider  albuterol (VENTOLIN HFA) 108 (90 Base) MCG/ACT inhaler Inhale 2 puffs into the lungs every 6 (six) hours as needed for wheezing or shortness of breath. 05/02/23  Yes Meagan Ancona R, NP  benzonatate (TESSALON) 100 MG capsule Take 1 capsule (100 mg total) by mouth every 8 (eight) hours. 05/02/23  Yes Timothy Townsel R, NP  promethazine-dextromethorphan (PROMETHAZINE-DM) 6.25-15 MG/5ML syrup Take 5 mLs by mouth at bedtime as needed. 05/02/23  Yes Benjamim Harnish R, NP  buPROPion (WELLBUTRIN XL) 150 MG 24 hr tablet Take 1 tablet (150 mg total) by mouth daily. 02/13/21   Clapacs, Jackquline Denmark, MD  busPIRone (BUSPAR) 7.5 MG tablet Take 1 tablet (7.5 mg total) by mouth 2 (two) times daily. 02/13/21   Clapacs, Jackquline Denmark, MD  cetirizine (ZYRTEC) 10 MG tablet Take 10 mg by mouth at bedtime. 07/16/19   [provider]  clotrimazole-betamethasone (LOTRISONE) cream Apply 1 Application topically 2 (two) times daily. 06/22/22   [provider]  dicyclomine (BENTYL) 10 MG capsule Take 1 capsule (10 mg total) by mouth 3 (three) times daily before meals for 5 days. 09/11/22 09/16/22  Menshew, Charlesetta Ivory,  PA-C  FLUoxetine (PROZAC) 40 MG capsule Take 2 capsules (80 mg total) by mouth daily. 02/13/21   Clapacs, Jackquline Denmark, MD  hydrOXYzine (ATARAX) 50 MG tablet Take 1 tablet (50 mg total) by mouth at bedtime. 02/13/21   Clapacs, Jackquline Denmark, MD  nicotine (NICODERM CQ - DOSED IN MG/24 HOURS) 14 mg/24hr patch Place 1 patch (14 mg total) onto the skin daily. Patient not taking: Reported on 08/05/2022 02/14/21   Clapacs, Jackquline Denmark, MD  nystatin (MYCOSTATIN/NYSTOP) powder Apply 1 Application topically 2 (two) times daily as needed. 06/12/22    [provider]  ondansetron (ZOFRAN-ODT) 4 MG disintegrating tablet Take 1 tablet (4 mg total) by mouth every 8 (eight) hours as needed for nausea or vomiting. 09/11/22   Menshew, Charlesetta Ivory, PA-C  pantoprazole (PROTONIX) 40 MG tablet Take 1 tablet (40 mg total) by mouth 2 (two) times daily before a meal. 08/06/22   Enedina Finner, MD  thiamine (VITAMIN B1) 100 MG tablet Take 100 mg by mouth every morning. 06/12/22   [provider]  traZODone (DESYREL) 50 MG tablet Take 25 mg by mouth at bedtime as needed.    [provider]    Family History History reviewed. No pertinent family history.  Social History Social History   Tobacco Use   Smoking status: Former    Current packs/day: 0.00    Types: Cigarettes    Quit date: 01/17/2020    Years since quitting: 3.2   Smokeless tobacco: Never  Vaping Use   Vaping status: Every Day   Start date: 12/17/2019   Substances: Flavoring  Substance Use Topics   Alcohol use: Not Currently    Alcohol/week: 2.0 standard drinks of alcohol    Types: 1 Glasses of wine, 1 Cans of beer per week    Comment: rarely   Drug use: Not Currently     Allergies   Other   Review of Systems Review of Systems   Physical Exam Triage Vital Signs ED Triage Vitals  Encounter Vitals Group     BP 05/02/23 1319 (!) 165/85     Systolic BP Percentile --      Diastolic BP Percentile --      Pulse Rate 05/02/23 1319 (!) 102     Resp 05/02/23 1319 20     Temp 05/02/23 1319 100.2 F (37.9 C)     Temp src --      SpO2 05/02/23 1319 99 %     Weight --      Height --      Head Circumference --      Peak Flow --      Pain Score 05/02/23 1315 5     Pain Loc --      Pain Education --      Exclude from Growth Chart --    No data found.  Updated Vital Signs BP (!) 165/85   Pulse (!) 102   Temp 100.2 F (37.9 C)   Resp 20   LMP 07/30/2020 (Approximate)   SpO2 99%   Visual Acuity Right Eye Distance:   Left Eye Distance:    Bilateral Distance:    Right Eye Near:   Left Eye Near:    Bilateral Near:     Physical Exam Constitutional:      Appearance: Normal appearance.  HENT:     Head: Normocephalic.     Right Ear: Tympanic membrane, ear canal and external ear normal.     Left Ear:  Tympanic membrane, ear canal and external ear normal.     Nose: Congestion present. No rhinorrhea.     Mouth/Throat:     Mouth: Mucous membranes are moist.     Pharynx: No oropharyngeal exudate or posterior oropharyngeal erythema.  Eyes:     Extraocular Movements: Extraocular movements intact.  Cardiovascular:     Rate and Rhythm: Normal rate and regular rhythm.     Pulses: Normal pulses.     Heart sounds: Normal heart sounds.  Pulmonary:     Effort: Pulmonary effort is normal.     Breath sounds: Normal breath sounds.  Musculoskeletal:     Cervical back: Normal range of motion and neck supple.  Neurological:     Mental Status: She is alert and oriented to person, place, and time. Mental status is at baseline.      UC Treatments / Results  Labs (all labs ordered are listed, but only abnormal results are displayed) Labs Reviewed  POC COVID19/FLU A&B COMBO - Normal    EKG   Radiology No results found.  Procedures Procedures (including critical care time)  Medications Ordered in UC Medications  acetaminophen (TYLENOL) tablet 650 mg (650 mg Oral Given 05/02/23 1326)    Initial Impression / Assessment and Plan / UC Course  I have reviewed the triage vital signs and the nursing notes.  Pertinent labs & imaging results that were available during my care of the patient were reviewed by me and considered in my medical decision making (see chart for details).  Viral URI with cough  Patient is in no signs of distress nor toxic appearing.  Vital signs are stable.  Low suspicion for pneumonia, pneumothorax or bronchitis and therefore will defer imaging.  COVID and flu test negative.  Prescribed albuterol inhaler,  Tessalon and Promethazine DM, endorses intolerance to prednisone therefore deferred.May use additional over-the-counter medications as needed for supportive care.  May follow-up with urgent care as needed if symptoms persist or worsen.  Note given.   Final Clinical Impressions(s) / UC Diagnoses   Final diagnoses:  Viral URI with cough     Discharge Instructions      Your symptoms today are most likely being caused by a virus and should steadily improve in time it can take up to 7 to 10 days before you truly start to see a turnaround however things will get better  COVID and flu test negative  May use inhaler taking 2 puffs every 4-6 hours as needed for shortness of breath and wheezing  May use Tessalon pill every 8 hours as needed for cough, may use cough syrup every 6 hours for additional comfort, be mindful this can make you feel sleepy    You can take Tylenol and/or Ibuprofen as needed for fever reduction and pain relief.   For cough: honey 1/2 to 1 teaspoon (you can dilute the honey in water or another fluid).  You can also use guaifenesin and dextromethorphan for cough. You can use a humidifier for chest congestion and cough.  If you don't have a humidifier, you can sit in the bathroom with the hot shower running.      For sore throat: try warm salt water gargles, cepacol lozenges, throat spray, warm tea or water with lemon/honey, popsicles or ice, or OTC cold relief medicine for throat discomfort.   For congestion: take a daily anti-histamine like Zyrtec, Claritin, and a oral decongestant, such as pseudoephedrine.  You can also use Flonase 1-2 sprays in each nostril  daily.   It is important to stay hydrated: drink plenty of fluids (water, gatorade/powerade/pedialyte, juices, or teas) to keep your throat moisturized and help further relieve irritation/discomfort.    ED Prescriptions     Medication Sig Dispense Auth. Provider   albuterol (VENTOLIN HFA) 108 (90 Base) MCG/ACT  inhaler Inhale 2 puffs into the lungs every 6 (six) hours as needed for wheezing or shortness of breath. 8 g Yoshiko Keleher R, NP   benzonatate (TESSALON) 100 MG capsule Take 1 capsule (100 mg total) by mouth every 8 (eight) hours. 21 capsule Elston Aldape R, NP   promethazine-dextromethorphan (PROMETHAZINE-DM) 6.25-15 MG/5ML syrup Take 5 mLs by mouth at bedtime as needed. 118 mL Tiffanny Lamarche, Elita Boone, NP      PDMP not reviewed this encounter.   Valinda Hoar, NP 05/02/23 1404

## 2023-05-02 NOTE — Discharge Instructions (Signed)
Your symptoms today are most likely being caused by a virus and should steadily improve in time it can take up to 7 to 10 days before you truly start to see a turnaround however things will get better  COVID and flu test negative  May use inhaler taking 2 puffs every 4-6 hours as needed for shortness of breath and wheezing  May use Tessalon pill every 8 hours as needed for cough, may use cough syrup every 6 hours for additional comfort, be mindful this can make you feel sleepy    You can take Tylenol and/or Ibuprofen as needed for fever reduction and pain relief.   For cough: honey 1/2 to 1 teaspoon (you can dilute the honey in water or another fluid).  You can also use guaifenesin and dextromethorphan for cough. You can use a humidifier for chest congestion and cough.  If you don't have a humidifier, you can sit in the bathroom with the hot shower running.      For sore throat: try warm salt water gargles, cepacol lozenges, throat spray, warm tea or water with lemon/honey, popsicles or ice, or OTC cold relief medicine for throat discomfort.   For congestion: take a daily anti-histamine like Zyrtec, Claritin, and a oral decongestant, such as pseudoephedrine.  You can also use Flonase 1-2 sprays in each nostril daily.   It is important to stay hydrated: drink plenty of fluids (water, gatorade/powerade/pedialyte, juices, or teas) to keep your throat moisturized and help further relieve irritation/discomfort.

## 2023-05-02 NOTE — ED Triage Notes (Signed)
Patient to Urgent Care with complaints of productive cough/ chest congestion/ body aches/ unsure of any fevers. Painful to breathe.   Symptoms started last night.   Meds: no otc meds.

## 2023-07-18 ENCOUNTER — Encounter: Payer: Self-pay | Admitting: *Deleted

## 2023-07-18 ENCOUNTER — Other Ambulatory Visit: Payer: Self-pay

## 2023-07-18 DIAGNOSIS — R1032 Left lower quadrant pain: Secondary | ICD-10-CM | POA: Diagnosis present

## 2023-07-18 LAB — CBC WITH DIFFERENTIAL/PLATELET
Abs Immature Granulocytes: 0.03 10*3/uL (ref 0.00–0.07)
Basophils Absolute: 0 10*3/uL (ref 0.0–0.1)
Basophils Relative: 1 %
Eosinophils Absolute: 0.2 10*3/uL (ref 0.0–0.5)
Eosinophils Relative: 2 %
HCT: 36.4 % (ref 36.0–46.0)
Hemoglobin: 11.4 g/dL — ABNORMAL LOW (ref 12.0–15.0)
Immature Granulocytes: 0 %
Lymphocytes Relative: 27 %
Lymphs Abs: 2 10*3/uL (ref 0.7–4.0)
MCH: 28.1 pg (ref 26.0–34.0)
MCHC: 31.3 g/dL (ref 30.0–36.0)
MCV: 89.7 fL (ref 80.0–100.0)
Monocytes Absolute: 0.5 10*3/uL (ref 0.1–1.0)
Monocytes Relative: 7 %
Neutro Abs: 4.6 10*3/uL (ref 1.7–7.7)
Neutrophils Relative %: 63 %
Platelets: 370 10*3/uL (ref 150–400)
RBC: 4.06 MIL/uL (ref 3.87–5.11)
RDW: 14 % (ref 11.5–15.5)
WBC: 7.3 10*3/uL (ref 4.0–10.5)
nRBC: 0 % (ref 0.0–0.2)

## 2023-07-18 LAB — COMPREHENSIVE METABOLIC PANEL WITH GFR
ALT: 13 U/L (ref 0–44)
AST: 17 U/L (ref 15–41)
Albumin: 4 g/dL (ref 3.5–5.0)
Alkaline Phosphatase: 50 U/L (ref 38–126)
Anion gap: 10 (ref 5–15)
BUN: 13 mg/dL (ref 6–20)
CO2: 23 mmol/L (ref 22–32)
Calcium: 9 mg/dL (ref 8.9–10.3)
Chloride: 104 mmol/L (ref 98–111)
Creatinine, Ser: 0.88 mg/dL (ref 0.44–1.00)
GFR, Estimated: >60 mL/min (ref >60)
Glucose, Bld: 93 mg/dL (ref 70–99)
Potassium: 4 mmol/L (ref 3.5–5.1)
Sodium: 137 mmol/L (ref 135–145)
Total Bilirubin: 0.3 mg/dL (ref 0.0–1.2)
Total Protein: 7.6 g/dL (ref 6.5–8.1)

## 2023-07-18 LAB — LIPASE, BLOOD: Lipase: 64 U/L — ABNORMAL HIGH (ref 11–51)

## 2023-07-18 NOTE — ED Triage Notes (Signed)
 Patient ambulatory to triage with steady gait, without difficulty or distress noted; pt reports hx multiple abd surgeries in past with adhesions; c/o left lower back/abd pain accomp by nausea and no BM x 3 days

## 2023-07-18 NOTE — ED Triage Notes (Signed)
 Pt ambulatory to triage.  Pt has abd pain.  Pain for 5 days.  Pt states she has had several abd surgeries.   No bm for 2 days.  Pt has nausea  no vomiting.  Pt alert . iv placed in triage.

## 2023-07-19 ENCOUNTER — Emergency Department
Admission: EM | Admit: 2023-07-19 | Discharge: 2023-07-19 | Disposition: A | Discharge status: Home / Self Care | Attending: Emergency Medicine | Admitting: Emergency Medicine

## 2023-07-19 ENCOUNTER — Emergency Department

## 2023-07-19 DIAGNOSIS — R1032 Left lower quadrant pain: Secondary | ICD-10-CM

## 2023-07-19 LAB — URINALYSIS, ROUTINE W REFLEX MICROSCOPIC
Bilirubin Urine: NEGATIVE
Glucose, UA: NEGATIVE mg/dL
Hgb urine dipstick: NEGATIVE
Ketones, ur: NEGATIVE mg/dL
Leukocytes,Ua: NEGATIVE
Nitrite: NEGATIVE
Protein, ur: NEGATIVE mg/dL
Specific Gravity, Urine: 1.002 — ABNORMAL LOW (ref 1.005–1.030)
pH: 5 (ref 5.0–8.0)

## 2023-07-19 MED ORDER — POLYETHYLENE GLYCOL 3350 17 G PO PACK: 17.0000 g | PACK | Freq: Every day | ORAL | 0 refills | Status: DC

## 2023-07-19 MED ORDER — DICYCLOMINE HCL 10 MG PO CAPS
10.0000 mg | ORAL_CAPSULE | Freq: Once | ORAL | Status: DC
  Filled 2023-07-19: qty 1

## 2023-07-19 MED ORDER — BENEFIBER DRINK MIX PO PACK: 1.0000 | PACK | Freq: Every day | ORAL | 0 refills | Status: DC

## 2023-07-19 MED ORDER — DICYCLOMINE HCL 10 MG PO CAPS: 10.0000 mg | ORAL_CAPSULE | Freq: Three times a day (TID) | ORAL | 0 refills | Status: DC

## 2023-07-19 MED ORDER — IOHEXOL 350 MG/ML SOLN
100.0000 mL | Freq: Once | INTRAVENOUS | Status: AC | PRN
  Administered 2023-07-19: 100 mL via INTRAVENOUS

## 2023-07-19 MED ORDER — ACETAMINOPHEN 500 MG PO TABS
1000.0000 mg | ORAL_TABLET | Freq: Once | ORAL | Status: DC
  Filled 2023-07-19: qty 2

## 2023-07-19 NOTE — ED Provider Notes (Signed)
 [facility] Provider Note    Event Date/Time   First MD Initiated Contact with Patient 07/19/23 [id number]     (approximate)   History   Abdominal Pain   HPI  Karen Park is a 43 y.o. female with history of anxiety, depression, migraines, multiple abdominal surgeries, presenting with left lower quadrant abdominal pain.  States that her last bowel movement was on Sunday.  Has nausea but no vomiting.  No fevers or urinary symptoms.   On intermittent chart review, she was admitted last year for upper GI bleed, had an EGD that was done that showed gastric bypass, normal jejunum, no inflammation to the gastrojejunal anastomosis, was discharged with p.o. Protonix .  Physical Exam   Triage Vital Signs: ED Triage Vitals  Encounter Vitals Group     BP 07/18/23 2128 127/86     Systolic BP Percentile --      Diastolic BP Percentile --      Pulse Rate 07/18/23 2128 (!) 109     Resp 07/18/23 2128 18     Temp 07/18/23 2128 97.8 F (36.6 C)     Temp Source 07/18/23 2128 Oral     SpO2 07/18/23 2128 100 %     Weight 07/18/23 2128 225 lb (102.1 kg)     Height 07/18/23 2128 5\' 6"  (1.676 m)     Head Circumference --      Peak Flow --      Pain Score 07/18/23 2135 8     Pain Loc --      Pain Education --      Exclude from Growth Chart --     Most recent vital signs: Vitals:   07/19/23 0239 07/19/23 0300  BP: (!) 132/90 127/84  Pulse: (!) 101 97  Resp:    Temp:    SpO2: 100% 100%     General: Awake, no distress.  CV:  Good peripheral perfusion.  Resp:  Normal effort.  Abd:  No distention.  Soft, mild tenderness to left lower quadrant without guarding Other:  Nontoxic-appearing, moving all 4 extremities   ED Results / Procedures / Treatments   Labs (all labs ordered are listed, but only abnormal results are displayed) Labs Reviewed  CBC WITH DIFFERENTIAL/PLATELET - Abnormal; Notable for the following components:      Result Value   Hemoglobin  11.4 (*)    All other components within normal limits  LIPASE, BLOOD - Abnormal; Notable for the following components:   Lipase 64 (*)    All other components within normal limits  URINALYSIS, ROUTINE W REFLEX MICROSCOPIC - Abnormal; Notable for the following components:   Color, Urine COLORLESS (*)    APPearance CLEAR (*)    Specific Gravity, Urine 1.002 (*)    All other components within normal limits  COMPREHENSIVE METABOLIC PANEL WITH GFR    RADIOLOGY On my independent interpretation, CT scan without obvious free air   PROCEDURES:  Critical Care performed: No  Procedures   MEDICATIONS ORDERED IN ED: Medications  acetaminophen  (TYLENOL ) tablet 1,000 mg (1,000 mg Oral Not Given 07/19/23 0320)  dicyclomine  (BENTYL ) capsule 10 mg (10 mg Oral Not Given 07/19/23 0320)  iohexol  (OMNIPAQUE ) 350 MG/ML injection 100 mL (100 mLs Intravenous Contrast Given 07/19/23 0022)     IMPRESSION / MDM / ASSESSMENT AND PLAN / ED COURSE  I reviewed the triage vital signs and the nursing notes.  Differential diagnosis includes, but is not limited to, SBO, partial SBO, constipation, colitis, diverticulitis.  Will get labs, UA, CT.  Patient's presentation is most consistent with acute presentation with potential threat to life or bodily function.  Independent interpretation of labs and imaging below.  Offered Tylenol  and Bentyl  but patient declined, wants to be discharged.  Will give her number to call for surgery in case she wants a follow-up, otherwise instructed her to follow-up with her primary care doctor as needed.  Will give her a prescription for Bentyl .  Also give her prescription for Benefiber and MiraLAX.  Otherwise considered but no indication for inpatient admission at this time, she is safe for outpatient management.  Will discharge with strict return precautions.    Clinical Course as of 07/19/23 0338  Thu Jul 19, 2023  0305 CT ABDOMEN PELVIS W  CONTRAST No acute abnormality noted.  [TT]  0305 Independent review of labs, electrolytes all severely deranged, LFTs are normal, lipase is mildly elevated, no leukocytosis, UA is not consistent with UTI. [TT]    Clinical Course User Index [TT] Drenda Gentle, Richard Champion, MD     FINAL CLINICAL IMPRESSION(S) / ED DIAGNOSES   Final diagnoses:  Left lower quadrant abdominal pain     Rx / DC Orders   ED Discharge Orders          Ordered    dicyclomine  (BENTYL ) 10 MG capsule  3 times daily before meals,   Status:  Discontinued        07/19/23 0316    polyethylene glycol (MIRALAX) 17 g packet  Daily,   Status:  Discontinued        07/19/23 0316    Wheat Dextrin (BENEFIBER DRINK MIX) PACK  Daily,   Status:  Discontinued        07/19/23 0316    dicyclomine  (BENTYL ) 10 MG capsule  3 times daily before meals        07/19/23 0331    polyethylene glycol (MIRALAX) 17 g packet  Daily        07/19/23 0331    Wheat Dextrin (BENEFIBER DRINK MIX) PACK  Daily        07/19/23 0331             Note:  This document was prepared using Dragon voice recognition software and may include unintentional dictation errors.    Shane Darling, MD 07/19/23 [phone]

## 2023-07-23 NOTE — Progress Notes (Unsigned)
 Patient ID: Karen Park, female   DOB: 11/03/1980, 43 y.o.   MRN: [id number]  Chief Complaint: Follow-up for ER visit for left lower quadrant pain.  History of Present Illness Karen Park is a 43 y.o. female with a known brief history of constipation prior to her ED visit.  Has been concerned about prior small bowel obstructions, presenting with similar pain.  Has history of of 8 abdominal surgeries.  She began with a C-section, subsequent hernias, subsequent gastric bypass surgery and the hysterectomy most recently.  Somewhere in there was small bowel resection or anastomotic revision it sounds like, and mesh removal and replacement perhaps.  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  Prior to her ED visit she notes she had not pooped in a few days and, was concerned because she had had a kink about a year and a half after her gastric bypass surgery that had to be operated on.  She was discharged with Bentyl , MiraLAX  and Benefiber, she is only taken the Bentyl  which seems to make her sleepy but has not utilized the others.  She does have a history of some cramping with her bowel movements.  And is only made use of a stool softener to this point previously.  Past Medical History Past Medical History:  Diagnosis Date   Anxiety    Arthritis    both knees, fingers, spine   Depression    History of hiatal hernia    repair 2020   History of kidney stones    Migraine    Pneumonia       Past Surgical History:  Procedure Laterality Date   ABDOMINAL HYSTERECTOMY     BARIATRIC SURGERY  01/02/2018   CESAREAN SECTION     CHOLECYSTECTOMY     COLONOSCOPY     dental work     ESOPHAGOGASTRODUODENOSCOPY     ESOPHAGOGASTRODUODENOSCOPY (EGD) WITH PROPOFOL  N/A 08/06/2022   Procedure: ESOPHAGOGASTRODUODENOSCOPY (EGD) WITH PROPOFOL ;  Surgeon: Marnee Sink, Park;  Location: [facility];  Service: Endoscopy;   Laterality: N/A;   exploratory laparoscopic     GASTRIC BYPASS     HERNIA REPAIR     x2   HYSTERECTOMY ABDOMINAL WITH SALPINGECTOMY Bilateral 08/16/2020   Procedure: HYSTERECTOMY ABDOMINAL WITH BILATERAL SALPINGECTOMY  (L) OOPHORECTOMY;  Surgeon: Karen Park;  Location: [facility];  Service: Gynecology;  Laterality: Bilateral;   OOPHORECTOMY Left 08/16/2020   Procedure: OOPHORECTOMY;  Surgeon: Karen Park;  Location: [facility];  Service: Gynecology;  Laterality: Left;    Allergies  Allergen Reactions   Other Swelling    Blue cheese.    Current Outpatient Medications  Medication Sig Dispense Refill   albuterol  (VENTOLIN  HFA) 108 (90 Base) MCG/ACT inhaler Inhale 2 puffs into the lungs every 6 (six) hours as needed for wheezing or shortness of breath. 8 g 2   clotrimazole-betamethasone (LOTRISONE) cream Apply 1 Application topically 2 (two) times daily.     dicyclomine  (BENTYL ) 10 MG capsule Take 1 capsule (10 mg total) by mouth 3 (three) times daily before meals for 5 days. 15 capsule 0   naltrexone (DEPADE) 50 MG tablet Take 50 mg by mouth daily.     ondansetron  (ZOFRAN -ODT) 4 MG disintegrating tablet Take 1 tablet (4 mg total) by mouth every 8 (eight) hours as needed for nausea or vomiting. 15 tablet 0   No current facility-administered medications for this visit.  Family History No family history on file.    Social History Social History   Tobacco Use   Smoking status: Former    Current packs/day: 0.00    Types: Cigarettes    Quit date: 01/17/2020    Years since quitting: 3.5    Passive exposure: Past   Smokeless tobacco: Never  Vaping Use   Vaping status: Every Day   Start date: 12/17/2019   Substances: Flavoring  Substance Use Topics   Alcohol use: Yes    Alcohol/week: 2.0 standard drinks of alcohol    Types: 1 Glasses of wine, 1 Cans of beer per week    Comment: rarely   Drug use: Not Currently        Review of Systems   Constitutional:  Positive for malaise/fatigue and weight loss.  HENT: Negative.    Eyes: Negative.   Respiratory:  Negative for cough.   Cardiovascular:  Negative for chest pain, palpitations, orthopnea and leg swelling.  Gastrointestinal:  Positive for abdominal pain and constipation. Negative for diarrhea.  Genitourinary: Negative.   Skin: Negative.   Neurological:  Positive for headaches (Migraines).  Psychiatric/Behavioral: Negative.       Physical Exam Blood pressure 102/62, pulse 87, height 5\' 6"  (1.676 m), weight 230 lb (104.3 kg), last menstrual period 07/30/2020, SpO2 98%. Last Weight  Most recent update: 07/24/2023  8:49 AM    Weight  104.3 kg (230 lb)             CONSTITUTIONAL: Well developed, and nourished, appropriately responsive and aware without distress.   EYES: Sclera non-icteric.   EARS, NOSE, MOUTH AND THROAT:  The oropharynx is clear. Oral mucosa is pink and moist.    Hearing is intact to voice.  NECK: Trachea is midline, and there is no jugular venous distension.  LYMPH NODES:  Lymph nodes in the neck are not appreciated. RESPIRATORY:  Normal respiratory effort without pathologic use of accessory muscles. CARDIOVASCULAR: Well perfused.  GI: The abdomen is  soft, nontender, and nondistended. There were no palpable masses.  I did not appreciate hepatosplenomegaly. There were normal bowel sounds.  MUSCULOSKELETAL:  Symmetrical muscle tone appreciated in all four extremities.    SKIN: Skin turgor is normal. No pathologic skin lesions appreciated.  Multiple multicolored tattoos on both upper extremities and more. NEUROLOGIC:  Motor and sensation appear grossly normal.  Cranial nerves are grossly without defect. PSYCH:  Alert and oriented to person, place and time. Affect is appropriate for situation.  Data Reviewed I have personally reviewed what is currently available of the patient's imaging, recent labs and medical records.   Labs:     Latest Ref Rng &  Units 07/18/2023    9:33 PM 09/11/2022    7:24 PM 08/06/2022    5:03 AM  CBC  WBC 4.0 - 10.5 K/uL 7.3  7.6  5.5   Hemoglobin 12.0 - 15.0 g/dL 74.2  59.5  63.8   Hematocrit 36.0 - 46.0 % 36.4  35.4  34.1   Platelets 150 - 400 K/uL 370  410  324       Latest Ref Rng & Units 07/18/2023    9:33 PM 09/11/2022    7:24 PM 08/06/2022    5:03 AM  CMP  Glucose 70 - 99 mg/dL 93  88  87   BUN 6 - 20 mg/dL 13  10  8    Creatinine 0.44 - 1.00 mg/dL 7.56  4.33  2.95   Sodium 135 - 145 mmol/L  137  136  139   Potassium 3.5 - 5.1 mmol/L 4.0  3.8  3.6   Chloride 98 - 111 mmol/L 104  105  112   CO2 22 - 32 mmol/L 23  21  24    Calcium 8.9 - 10.3 mg/dL 9.0  8.7  8.0   Total Protein 6.5 - 8.1 g/dL 7.6  7.3    Total Bilirubin 0.0 - 1.2 mg/dL 0.3  0.4    Alkaline Phos 38 - 126 U/L 50  62    AST 15 - 41 U/L 17  16    ALT 0 - 44 U/L 13  13      Imaging: Radiological images reviewed:  CLINICAL DATA:  Acute abdominal pain for several days, initial encounter   EXAM: CT ABDOMEN AND PELVIS WITH CONTRAST   TECHNIQUE: Multidetector CT imaging of the abdomen and pelvis was performed using the standard protocol following bolus administration of intravenous contrast.   RADIATION DOSE REDUCTION: This exam was performed according to the departmental dose-optimization program which includes automated exposure control, adjustment of the mA and/or kV according to patient size and/or use of iterative reconstruction technique.   CONTRAST:  OMNIPAQUE  IOHEXOL  350 MG/ML SOLN   COMPARISON:  09/11/2022   FINDINGS: Lower chest: No acute abnormality.   Hepatobiliary: No focal liver abnormality is seen. Status post cholecystectomy. No biliary dilatation.   Pancreas: Unremarkable. No pancreatic ductal dilatation or surrounding inflammatory changes.   Spleen: Normal in size without focal abnormality.   Adrenals/Urinary Tract: Adrenal glands are within normal limits. Kidneys demonstrate a normal enhancement  pattern bilaterally. No renal calculi or obstructive changes are noted. The bladder is partially distended.   Stomach/Bowel: No obstructive or inflammatory changes of the colon are seen. The appendix is air-filled and within normal limits. Postsurgical changes are noted in the small bowel and stomach consistent with prior bariatric surgery. No obstructive changes are seen.   Vascular/Lymphatic: No significant vascular findings are present. No enlarged abdominal or pelvic lymph nodes.   Reproductive: Status post hysterectomy. No adnexal masses.   Other: No abdominal wall hernia or abnormality. No abdominopelvic ascites.   Musculoskeletal: No acute or significant osseous findings.   IMPRESSION: No acute abnormality noted.     Electronically Signed   By: Violeta Grey M.D.   On: 07/19/2023 00:34 Within last 24 hrs: No results found.  Assessment    Left lower quadrant pain, likely associated with constipation. Multiple previous abdominal operations. Patient Active Problem List   Diagnosis Date Noted   UGIB (upper gastrointestinal bleed) 08/05/2022   Severe recurrent major depression without psychotic features (HCC) 02/11/2021   MDD (major depressive disorder), recurrent episode, moderate (HCC) 02/10/2021   Alcohol abuse 02/10/2021   Menorrhagia 08/16/2020   Post-operative state 08/16/2020    Plan    Advised to pursue a goal of 25 to 30 g of fiber daily.  The majority of this may be through natural sources, advised to ensure a minimal daily fiber supplementation.  Various forms of supplements discussed.  Recommend Psyllium husk, with options of mixing with beverage or applesauce to make more tolerable. Strongly advised to consume more fluids(especially in proximity to fiber intake) and to ensure adequate hydration.   Watch color of urine to determine adequacy of hydration.  Clarity is pursued in urine output, and bowel activity that responds and corresponds to significant meal  intake.   We need to avoid deferring having bowel movements, advised to take the time at the  first sign of sensation, typically following meals, and in the morning.  The need to avoid more frequently, and the presence of flatus may indicate the need for bowel movement.  Do not defer for later.  Addition of MiraLAX  (or its generic equivalent) may be needed ensure at least twice daily bowel movements.  If multiple doses of MiraLAX  are necessary utilize them. Never skip a day... Do not tolerate a day without a bowel movement unless you are fasting.  To be regular, we must do the above EVERY day.   Soluble Fiber Dissolves in Water: Soluble fiber dissolves in water to form a gel-like substance. Slows Digestion: This type of fiber slows down digestion, which can help control blood sugar levels and lower cholesterol. Sources: Common sources include oats, beans, apples, citrus fruits, and psyllium. Benefits: Helps manage cholesterol levels. Aids in blood sugar control. Increases healthy gut bacteria, which can lower inflammation and improve digestion.  Insoluble Fiber Does Not Dissolve in Water: Insoluble fiber does not dissolve in water and remains mostly intact as it passes through the digestive system. Adds Bulk to Stool: It adds bulk to stool, which helps promote regular bowel movements and prevent constipation. Sources: Common sources include whole grains, nuts, beans, and vegetables like cauliflower and potatoes. Benefits: Improves bowel health and regularity. Reduces the risk of colorectal conditions like hemorrhoids and diverticulitis. Supports insulin sensitivity in people with diabetes.  Both types of fiber are essential for overall health, and it's beneficial to include a 50/50 mix of both in your diet.    We also engaged in a long discussion regarding certain food sensitivities, processed foods and the goal of obtaining regular consistent predictable bowel activity until likely  improve her overall health.   Face-to-face time spent with the patient and accompanying care providers(if present) was 45 minutes, spent counseling, educating, and coordinating care of the patient.    These notes generated with voice recognition software. I apologize for typographical errors.  Flynn Hylan M.D., FACS 07/24/2023, 10:52 AM

## 2023-07-24 ENCOUNTER — Ambulatory Visit (INDEPENDENT_AMBULATORY_CARE_PROVIDER_SITE_OTHER): Admitting: Surgery

## 2023-07-24 ENCOUNTER — Encounter: Payer: Self-pay | Admitting: Surgery

## 2023-07-24 VITALS — BP 102/62 | HR 87 | Ht 66.0 in | Wt 230.0 lb

## 2023-07-24 DIAGNOSIS — Z9884 Bariatric surgery status: Secondary | ICD-10-CM | POA: Diagnosis not present

## 2023-07-24 DIAGNOSIS — R1032 Left lower quadrant pain: Secondary | ICD-10-CM | POA: Diagnosis not present

## 2023-07-24 DIAGNOSIS — K5900 Constipation, unspecified: Secondary | ICD-10-CM | POA: Insufficient documentation

## 2023-07-24 NOTE — Patient Instructions (Addendum)
 Follow-up with our office as needed.  Please call and ask to speak with a nurse if you develop questions or concerns.   Advised to pursue a goal of 25 to 30 g of fiber daily.  The majority of this may be through natural sources, advised to ensure a minimal daily fiber supplementation.  Various forms of supplements discussed.  Recommend Psyllium husk, with options of mixing with beverage or applesauce to make more tolerable. Strongly advised to consume more fluids(especially in proximity to fiber intake) and to ensure adequate hydration.   Watch color of urine to determine adequacy of hydration.  Clarity is pursued in urine output, and bowel activity that responds and corresponds to significant meal intake.   We need to avoid deferring having bowel movements, advised to take the time at the first sign of sensation, typically following meals, and in the morning.  The need to avoid more frequently, and the presence of flatus may indicate the need for bowel movement.  Do not defer for later.  Addition of MiraLAX (or its generic equivalent) may be needed ensure at least twice daily bowel movements.  If multiple doses of MiraLAX are necessary utilize them. Never skip a day... Do not tolerate a day without a bowel movement unless you are fasting.  To be regular, we must do the above EVERY day.   Soluble Fiber Dissolves in Water: Soluble fiber dissolves in water to form a gel-like substance. Slows Digestion: This type of fiber slows down digestion, which can help control blood sugar levels and lower cholesterol. Sources: Common sources include oats, beans, apples, citrus fruits, and psyllium. Benefits: Helps manage cholesterol levels. Aids in blood sugar control. Increases healthy gut bacteria, which can lower inflammation and improve digestion.  Insoluble Fiber Does Not Dissolve in Water: Insoluble fiber does not dissolve in water and remains mostly intact as it passes through the digestive  system. Adds Bulk to Stool: It adds bulk to stool, which helps promote regular bowel movements and prevent constipation. Sources: Common sources include whole grains, nuts, beans, and vegetables like cauliflower and potatoes. Benefits: Improves bowel health and regularity. Reduces the risk of colorectal conditions like hemorrhoids and diverticulitis. Supports insulin sensitivity in people with diabetes.  Both types of fiber are essential for overall health, and it's beneficial to include a 50/50 mix of both in your diet.

## 2024-03-18 ENCOUNTER — Ambulatory Visit
Admission: RE | Admit: 2024-03-18 | Discharge: 2024-03-18 | Disposition: A | Discharge status: Home / Self Care | Attending: Emergency Medicine | Admitting: Emergency Medicine

## 2024-03-18 VITALS — BP 133/66 | HR 106 | Temp 98.3°F | Resp 18

## 2024-03-18 DIAGNOSIS — L259 Unspecified contact dermatitis, unspecified cause: Secondary | ICD-10-CM | POA: Diagnosis not present

## 2024-03-18 MED ORDER — DEXAMETHASONE SOD PHOSPHATE PF 10 MG/ML IJ SOLN
10.0000 mg | Freq: Once | INTRAMUSCULAR | Status: AC
  Administered 2024-03-18: 10 mg via INTRAMUSCULAR

## 2024-03-18 MED ORDER — PREDNISONE 10 MG (21) PO TBPK: ORAL_TABLET | Freq: Every day | ORAL | 0 refills | Status: DC

## 2024-03-18 NOTE — Discharge Instructions (Addendum)
You were given an injection of a steroid called dexamethasone.  Start the prednisone taper tomorrow as directed.    Take Zyrtec as directed.    Follow up with your primary care provider.    

## 2024-03-18 NOTE — ED Triage Notes (Signed)
 Patient to Urgent Care with complaints of an itchy rash to her entire body.   Can't tolerate benadryl . Has used multiple tubes of cortisone cream. Seems to be worsening.   Symptoms started after her husband changed their laundry detergent 2 weeks ago. Has rewashed everything since.

## 2024-03-18 NOTE — ED Provider Notes (Signed)
 " CAY RALPH PELT    CSN: [id number] Arrival date & time: 03/18/24  1756      History   Chief Complaint Chief Complaint  Patient presents with   Allergic Reaction    Itchy rash all over my body. Cortisone cream not working. Cant take Benadryl . Ongoing for about 2 weeks. - Entered by patient    HPI Alvin E Valma Rotenberg is a 44 y.o. female.  Patient presents with a 2-week history of pruritic rash on her trunk and extremities.  Treatment attempted with cortisone cream but the rash is spreading and becoming more pruritic.  The rash started after her husband changed their laundry detergent 2 weeks ago.  No other new products.  No new foods or medications.  No difficulty swallowing or breathing.  Patient states she is unable to tolerate Benadryl  as it causes her to feel jittery and nervous.  She has taken Zyrtec in the past without difficulty; none taken recently.  The history is provided by the patient and medical records.    Past Medical History:  Diagnosis Date   Anxiety    Arthritis    both knees, fingers, spine   Depression    History of hiatal hernia    repair 2020   History of kidney stones    Migraine    Pneumonia     Patient Active Problem List   Diagnosis Date Noted   Constipation 07/24/2023   Status post gastric bypass for obesity 07/24/2023   UGIB (upper gastrointestinal bleed) 08/05/2022   Severe recurrent major depression without psychotic features (HCC) 02/11/2021   MDD (major depressive disorder), recurrent episode, moderate (HCC) 02/10/2021   Alcohol abuse 02/10/2021   Menorrhagia 08/16/2020   Post-operative state 08/16/2020    Past Surgical History:  Procedure Laterality Date   ABDOMINAL HYSTERECTOMY     BARIATRIC SURGERY  01/02/2018   CESAREAN SECTION     CHOLECYSTECTOMY     COLONOSCOPY     dental work     ESOPHAGOGASTRODUODENOSCOPY     ESOPHAGOGASTRODUODENOSCOPY (EGD) WITH PROPOFOL  N/A 08/06/2022   Procedure: ESOPHAGOGASTRODUODENOSCOPY (EGD)  WITH PROPOFOL ;  Surgeon: Jinny Carmine, MD;  Location: [facility];  Service: Endoscopy;  Laterality: N/A;   exploratory laparoscopic     GASTRIC BYPASS     HERNIA REPAIR     x2   HYSTERECTOMY ABDOMINAL WITH SALPINGECTOMY Bilateral 08/16/2020   Procedure: HYSTERECTOMY ABDOMINAL WITH BILATERAL SALPINGECTOMY  (L) OOPHORECTOMY;  Surgeon: Schermerhorn, Ned PARAS, MD;  Location: [facility];  Service: Gynecology;  Laterality: Bilateral;   OOPHORECTOMY Left 08/16/2020   Procedure: OOPHORECTOMY;  Surgeon: Schermerhorn, Ned PARAS, MD;  Location: [facility];  Service: Gynecology;  Laterality: Left;    OB History   No obstetric history on file.      Home Medications    Prior to Admission medications  Medication Sig Start Date End Date Taking? Authorizing Provider  predniSONE  (STERAPRED UNI-PAK 21 TAB) 10 MG (21) TBPK tablet Take by mouth daily. As directed 03/19/24  Yes Corlis Burnard DEL, NP  albuterol  (VENTOLIN  HFA) 108 (90 Base) MCG/ACT inhaler Inhale 2 puffs into the lungs every 6 (six) hours as needed for wheezing or shortness of breath. 05/02/23   White, Shelba SAUNDERS, NP  clotrimazole-betamethasone (LOTRISONE) cream Apply 1 Application topically 2 (two) times daily. Patient not taking: Reported on 03/18/2024 06/22/22   [provider]  dicyclomine  (BENTYL ) 10 MG capsule Take 1 capsule (10 mg total) by mouth 3 (three) times daily before meals for 5 days.  Patient not taking: Reported on 03/18/2024 07/19/23 07/24/23  Waymond Lorelle Cummins, MD  naltrexone (DEPADE) 50 MG tablet Take 50 mg by mouth daily. Patient not taking: Reported on 03/18/2024    [provider]  ondansetron  (ZOFRAN -ODT) 4 MG disintegrating tablet Take 1 tablet (4 mg total) by mouth every 8 (eight) hours as needed for nausea or vomiting. Patient not taking: Reported on 03/18/2024 09/11/22   Menshew, Candida LULLA Kings, PA-C    Family History History reviewed. No pertinent family history.  Social History Social History[1]   Allergies    Other and Benadryl  [diphenhydramine ]   Review of Systems Review of Systems  Constitutional:  Negative for chills and fever.  HENT:  Negative for sore throat, trouble swallowing and voice change.   Respiratory:  Negative for cough and shortness of breath.   Skin:  Positive for rash. Negative for color change.     Physical Exam Triage Vital Signs ED Triage Vitals  Encounter Vitals Group     BP 03/18/24 1806 133/66     Girls Systolic BP Percentile --      Girls Diastolic BP Percentile --      Boys Systolic BP Percentile --      Boys Diastolic BP Percentile --      Pulse Rate 03/18/24 1806 (!) 106     Resp 03/18/24 1806 18     Temp 03/18/24 1806 98.3 F (36.8 C)     Temp src --      SpO2 03/18/24 1806 98 %     Weight --      Height --      Head Circumference --      Peak Flow --      Pain Score 03/18/24 1813 0     Pain Loc --      Pain Education --      Exclude from Growth Chart --    No data found.  Updated Vital Signs BP 133/66   Pulse (!) 106   Temp 98.3 F (36.8 C)   Resp 18   LMP 07/30/2020   SpO2 98%   Visual Acuity Right Eye Distance:   Left Eye Distance:   Bilateral Distance:    Right Eye Near:   Left Eye Near:    Bilateral Near:     Physical Exam Constitutional:      General: She is not in acute distress. HENT:     Mouth/Throat:     Mouth: Mucous membranes are moist.  Cardiovascular:     Rate and Rhythm: Normal rate.  Pulmonary:     Effort: Pulmonary effort is normal. No respiratory distress.  Skin:    General: Skin is warm and dry.     Findings: Rash present.     Comments: Scattered pink maculopapular rash on lower back and arms.  No open wounds or drainage.  Neurological:     Mental Status: She is alert.      UC Treatments / Results  Labs (all labs ordered are listed, but only abnormal results are displayed) Labs Reviewed - No data to display  EKG   Radiology No results found.  Procedures Procedures (including critical  care time)  Medications Ordered in UC Medications  dexamethasone  (DECADRON ) injection 10 mg (10 mg Intramuscular Given 03/18/24 1839)    Initial Impression / Assessment and Plan / UC Course  I have reviewed the triage vital signs and the nursing notes.  Pertinent labs & imaging results that were available during my  care of the patient were reviewed by me and considered in my medical decision making (see chart for details).   Contact dermatitis.  Patient's rash started after her husband used a new laundry detergent.  Dexamethasone  given here.  She is somewhat reluctant to take prednisone  as she states it causes her to be hungry.  Prednisone  taper sent to patient's pharmacy to start tomorrow if she would like.  Instructed her to take Zyrtec at bedtime for 14 days starting tonight.  Education provided on contact dermatitis.  Instructed her to follow-up with her PCP.  She agrees to plan of care.  Final Clinical Impressions(s) / UC Diagnoses   Final diagnoses:  Contact dermatitis, unspecified contact dermatitis type, unspecified trigger     Discharge Instructions      You were given an injection of a steroid called dexamethasone .  Start the prednisone  taper tomorrow as directed.    Take Zyrtec as directed.    Follow up with your primary care provider.       ED Prescriptions     Medication Sig Dispense Auth. Provider   predniSONE  (STERAPRED UNI-PAK 21 TAB) 10 MG (21) TBPK tablet Take by mouth daily. As directed 21 tablet Corlis Burnard DEL, NP      PDMP not reviewed this encounter.    [1]  Social History Tobacco Use   Smoking status: Former    Current packs/day: 0.00    Types: Cigarettes    Quit date: 01/17/2020    Years since quitting: 4.1    Passive exposure: Past   Smokeless tobacco: Never  Vaping Use   Vaping status: Every Day   Start date: 12/17/2019   Substances: Flavoring  Substance Use Topics   Alcohol use: Yes    Alcohol/week: 2.0 standard drinks of alcohol     Types: 1 Glasses of wine, 1 Cans of beer per week    Comment: rarely   Drug use: Not Currently     Corlis Burnard DEL, NP 03/18/24 1842  "

## 2024-04-14 ENCOUNTER — Ambulatory Visit
Admission: RE | Admit: 2024-04-14 | Discharge: 2024-04-14 | Disposition: A | Discharge status: Home / Self Care | Source: Ambulatory Visit | Attending: Emergency Medicine | Admitting: Emergency Medicine

## 2024-04-14 VITALS — BP 134/82 | HR 89 | Temp 98.2°F | Resp 18

## 2024-04-14 DIAGNOSIS — R197 Diarrhea, unspecified: Secondary | ICD-10-CM | POA: Diagnosis not present

## 2024-04-14 DIAGNOSIS — J069 Acute upper respiratory infection, unspecified: Secondary | ICD-10-CM | POA: Diagnosis not present

## 2024-04-14 DIAGNOSIS — Z20822 Contact with and (suspected) exposure to covid-19: Secondary | ICD-10-CM

## 2024-04-14 LAB — POCT RAPID STREP A (OFFICE): Rapid Strep A Screen: NEGATIVE

## 2024-04-14 LAB — POC SOFIA SARS ANTIGEN FIA: SARS Coronavirus 2 Ag: NEGATIVE

## 2024-04-14 LAB — POCT INFLUENZA A/B
Influenza A, POC: NEGATIVE
Influenza B, POC: NEGATIVE

## 2024-04-14 MED ORDER — ONDANSETRON 8 MG PO TBDP: ORAL_TABLET | ORAL | 0 refills | Status: AC

## 2024-04-14 MED ORDER — HYDROCOD POLI-CHLORPHE POLI ER 10-8 MG/5ML PO SUER: 5.0000 mL | Freq: Two times a day (BID) | ORAL | 0 refills | Status: AC | PRN

## 2024-04-14 MED ORDER — AZELASTINE HCL 0.1 % NA SOLN: 2.0000 | Freq: Two times a day (BID) | NASAL | 0 refills | Status: AC
